# Patient Record
Sex: Male | Born: 1955 | Race: White | Hispanic: No | Smoking: Current every day smoker
Health system: Southern US, Community
[De-identification: ages and names within clinical notes are randomized; demographics above are authoritative.]

## PROBLEM LIST (undated history)

## (undated) DIAGNOSIS — J449 Chronic obstructive pulmonary disease, unspecified: Secondary | ICD-10-CM

## (undated) DIAGNOSIS — Z720 Tobacco use: Secondary | ICD-10-CM

## (undated) DIAGNOSIS — E785 Hyperlipidemia, unspecified: Secondary | ICD-10-CM

## (undated) DIAGNOSIS — I10 Essential (primary) hypertension: Secondary | ICD-10-CM

## (undated) DIAGNOSIS — M1990 Unspecified osteoarthritis, unspecified site: Secondary | ICD-10-CM

## (undated) DIAGNOSIS — R9431 Abnormal electrocardiogram [ECG] [EKG]: Secondary | ICD-10-CM

## (undated) DIAGNOSIS — G4700 Insomnia, unspecified: Secondary | ICD-10-CM

## (undated) DIAGNOSIS — M109 Gout, unspecified: Secondary | ICD-10-CM

## (undated) DIAGNOSIS — N189 Chronic kidney disease, unspecified: Secondary | ICD-10-CM

## (undated) DIAGNOSIS — K759 Inflammatory liver disease, unspecified: Secondary | ICD-10-CM

## (undated) DIAGNOSIS — I2510 Atherosclerotic heart disease of native coronary artery without angina pectoris: Secondary | ICD-10-CM

## (undated) DIAGNOSIS — I219 Acute myocardial infarction, unspecified: Secondary | ICD-10-CM

## (undated) DIAGNOSIS — S065X9A Traumatic subdural hemorrhage with loss of consciousness of unspecified duration, initial encounter: Secondary | ICD-10-CM

## (undated) HISTORY — DX: Inflammatory liver disease, unspecified: K75.9

## (undated) HISTORY — DX: Insomnia, unspecified: G47.00

## (undated) HISTORY — DX: Gout, unspecified: M10.9

## (undated) HISTORY — DX: Hyperlipidemia, unspecified: E78.5

## (undated) HISTORY — PX: CARDIAC CATHETERIZATION: SHX172

## (undated) HISTORY — DX: Traumatic subdural hemorrhage with loss of consciousness of unspecified duration, initial encounter: S06.5X9A

## (undated) HISTORY — DX: Abnormal electrocardiogram (ECG) (EKG): R94.31

## (undated) HISTORY — DX: Essential (primary) hypertension: I10

---

## 1982-12-13 HISTORY — PX: APPENDECTOMY: SHX54

## 1997-12-13 DIAGNOSIS — I219 Acute myocardial infarction, unspecified: Secondary | ICD-10-CM

## 1997-12-13 HISTORY — DX: Acute myocardial infarction, unspecified: I21.9

## 2006-01-31 DIAGNOSIS — K219 Gastro-esophageal reflux disease without esophagitis: Secondary | ICD-10-CM | POA: Insufficient documentation

## 2006-02-02 ENCOUNTER — Ambulatory Visit: Payer: Self-pay | Admitting: Family Medicine

## 2006-02-08 ENCOUNTER — Encounter: Admission: RE | Admit: 2006-02-08 | Discharge: 2006-02-08 | Payer: Self-pay | Admitting: Neurology

## 2006-02-22 ENCOUNTER — Encounter: Admission: RE | Admit: 2006-02-22 | Discharge: 2006-02-22 | Payer: Self-pay | Admitting: Neurology

## 2006-11-30 ENCOUNTER — Emergency Department: Payer: Self-pay | Admitting: Emergency Medicine

## 2006-12-11 ENCOUNTER — Emergency Department: Payer: Self-pay | Admitting: Emergency Medicine

## 2009-02-03 DIAGNOSIS — R9431 Abnormal electrocardiogram [ECG] [EKG]: Secondary | ICD-10-CM

## 2009-02-03 HISTORY — DX: Abnormal electrocardiogram (ECG) (EKG): R94.31

## 2010-07-27 ENCOUNTER — Emergency Department: Payer: Self-pay | Admitting: Emergency Medicine

## 2011-03-01 ENCOUNTER — Inpatient Hospital Stay: Payer: Self-pay | Admitting: *Deleted

## 2011-03-02 HISTORY — PX: OTHER SURGICAL HISTORY: SHX169

## 2011-03-11 ENCOUNTER — Ambulatory Visit: Payer: Self-pay | Admitting: Internal Medicine

## 2013-01-02 ENCOUNTER — Ambulatory Visit: Payer: Self-pay | Admitting: Family Medicine

## 2013-01-02 LAB — BASIC METABOLIC PANEL
BUN: 19 mg/dL (ref 4–21)
Creatinine: 1.5 mg/dL — AB (ref <=1.3)
GLUCOSE: 113 mg/dL
Potassium: 4.3 mmol/L (ref 3.4–5.3)
Sodium: 140 mmol/L (ref 137–147)

## 2013-01-02 LAB — LIPID PANEL
CHOLESTEROL: 177 mg/dL (ref 0–200)
HDL: 57 mg/dL (ref 35–70)
LDL Cholesterol: 103 mg/dL
TRIGLYCERIDES: 86 mg/dL (ref 40–160)

## 2013-01-02 LAB — HEPATIC FUNCTION PANEL: ALT: 27 U/L (ref 10–40)

## 2013-07-30 ENCOUNTER — Ambulatory Visit: Payer: Self-pay | Admitting: Internal Medicine

## 2013-08-06 DIAGNOSIS — Z955 Presence of coronary angioplasty implant and graft: Secondary | ICD-10-CM | POA: Insufficient documentation

## 2013-08-06 DIAGNOSIS — F17200 Nicotine dependence, unspecified, uncomplicated: Secondary | ICD-10-CM | POA: Insufficient documentation

## 2015-01-07 DIAGNOSIS — S02401A Maxillary fracture, unspecified, initial encounter for closed fracture: Secondary | ICD-10-CM | POA: Insufficient documentation

## 2015-01-07 DIAGNOSIS — S22009A Unspecified fracture of unspecified thoracic vertebra, initial encounter for closed fracture: Secondary | ICD-10-CM | POA: Insufficient documentation

## 2015-01-07 DIAGNOSIS — S0500XA Injury of conjunctiva and corneal abrasion without foreign body, unspecified eye, initial encounter: Secondary | ICD-10-CM | POA: Insufficient documentation

## 2015-01-07 DIAGNOSIS — I252 Old myocardial infarction: Secondary | ICD-10-CM | POA: Insufficient documentation

## 2015-01-07 DIAGNOSIS — R112 Nausea with vomiting, unspecified: Secondary | ICD-10-CM | POA: Insufficient documentation

## 2015-01-07 DIAGNOSIS — S065X9A Traumatic subdural hemorrhage with loss of consciousness of unspecified duration, initial encounter: Secondary | ICD-10-CM

## 2015-01-07 DIAGNOSIS — S065XAA Traumatic subdural hemorrhage with loss of consciousness status unknown, initial encounter: Secondary | ICD-10-CM

## 2015-01-07 HISTORY — DX: Traumatic subdural hemorrhage with loss of consciousness status unknown, initial encounter: S06.5XAA

## 2015-01-07 HISTORY — DX: Traumatic subdural hemorrhage with loss of consciousness of unspecified duration, initial encounter: S06.5X9A

## 2015-05-29 DIAGNOSIS — G4700 Insomnia, unspecified: Secondary | ICD-10-CM | POA: Insufficient documentation

## 2015-05-29 DIAGNOSIS — M109 Gout, unspecified: Secondary | ICD-10-CM | POA: Insufficient documentation

## 2015-05-29 DIAGNOSIS — I2510 Atherosclerotic heart disease of native coronary artery without angina pectoris: Secondary | ICD-10-CM | POA: Insufficient documentation

## 2015-05-30 ENCOUNTER — Ambulatory Visit (INDEPENDENT_AMBULATORY_CARE_PROVIDER_SITE_OTHER): Payer: BLUE CROSS/BLUE SHIELD | Admitting: Family Medicine

## 2015-05-30 ENCOUNTER — Encounter: Payer: Self-pay | Admitting: Family Medicine

## 2015-05-30 VITALS — BP 148/90 | HR 64 | Temp 97.9°F | Resp 16 | Ht 65.0 in | Wt 193.0 lb

## 2015-05-30 DIAGNOSIS — Z09 Encounter for follow-up examination after completed treatment for conditions other than malignant neoplasm: Secondary | ICD-10-CM | POA: Diagnosis not present

## 2015-05-30 DIAGNOSIS — I10 Essential (primary) hypertension: Secondary | ICD-10-CM

## 2015-05-30 DIAGNOSIS — F17200 Nicotine dependence, unspecified, uncomplicated: Secondary | ICD-10-CM

## 2015-05-30 DIAGNOSIS — I2510 Atherosclerotic heart disease of native coronary artery without angina pectoris: Secondary | ICD-10-CM | POA: Diagnosis not present

## 2015-05-30 DIAGNOSIS — E782 Mixed hyperlipidemia: Secondary | ICD-10-CM | POA: Insufficient documentation

## 2015-05-30 DIAGNOSIS — Z0000 Encounter for general adult medical examination without abnormal findings: Secondary | ICD-10-CM | POA: Diagnosis not present

## 2015-05-30 DIAGNOSIS — H9202 Otalgia, left ear: Secondary | ICD-10-CM

## 2015-05-30 NOTE — Patient Instructions (Signed)
Smoking Cessation Quitting smoking is important to your health and has many advantages. However, it is not always easy to quit since nicotine is a very addictive drug. Oftentimes, people try 3 times or more before being able to quit. This document explains the best ways for you to prepare to quit smoking. Quitting takes hard work and a lot of effort, but you can do it. ADVANTAGES OF QUITTING SMOKING  You will live longer, feel better, and live better.  Your body will feel the impact of quitting smoking almost immediately.  Within 20 minutes, blood pressure decreases. Your pulse returns to its normal level.  After 8 hours, carbon monoxide levels in the blood return to normal. Your oxygen level increases.  After 24 hours, the chance of having a heart attack starts to decrease. Your breath, hair, and body stop smelling like smoke.  After 48 hours, damaged nerve endings begin to recover. Your sense of taste and smell improve.  After 72 hours, the body is virtually free of nicotine. Your bronchial tubes relax and breathing becomes easier.  After 2 to 12 weeks, lungs can hold more air. Exercise becomes easier and circulation improves.  The risk of having a heart attack, stroke, cancer, or lung disease is greatly reduced.  After 1 year, the risk of coronary heart disease is cut in half.  After 5 years, the risk of stroke falls to the same as a nonsmoker.  After 10 years, the risk of lung cancer is cut in half and the risk of other cancers decreases significantly.  After 15 years, the risk of coronary heart disease drops, usually to the level of a nonsmoker.  If you are pregnant, quitting smoking will improve your chances of having a healthy baby.  The people you live with, especially any children, will be healthier.  You will have extra money to spend on things other than cigarettes. QUESTIONS TO THINK ABOUT BEFORE ATTEMPTING TO QUIT You may want to talk about your answers with your  health care provider.  Why do you want to quit?  If you tried to quit in the past, what helped and what did not?  What will be the most difficult situations for you after you quit? How will you plan to handle them?  Who can help you through the tough times? Your family? Friends? A health care provider?  What pleasures do you get from smoking? What ways can you still get pleasure if you quit? Here are some questions to ask your health care provider:  How can you help me to be successful at quitting?  What medicine do you think would be best for me and how should I take it?  What should I do if I need more help?  What is smoking withdrawal like? How can I get information on withdrawal? GET READY  Set a quit date.  Change your environment by getting rid of all cigarettes, ashtrays, matches, and lighters in your home, car, or work. Do not let people smoke in your home.  Review your past attempts to quit. Think about what worked and what did not. GET SUPPORT AND ENCOURAGEMENT You have a better chance of being successful if you have help. You can get support in many ways.  Tell your family, friends, and coworkers that you are going to quit and need their support. Ask them not to smoke around you.  Get individual, group, or telephone counseling and support. Programs are available at local hospitals and health centers. Call   your local health department for information about programs in your area.  Spiritual beliefs and practices may help some smokers quit.  Download a "quit meter" on your computer to keep track of quit statistics, such as how long you have gone without smoking, cigarettes not smoked, and money saved.  Get a self-help book about quitting smoking and staying off tobacco. LEARN NEW SKILLS AND BEHAVIORS  Distract yourself from urges to smoke. Talk to someone, go for a walk, or occupy your time with a task.  Change your normal routine. Take a different route to work.  Drink tea instead of coffee. Eat breakfast in a different place.  Reduce your stress. Take a hot bath, exercise, or read a book.  Plan something enjoyable to do every day. Reward yourself for not smoking.  Explore interactive web-based programs that specialize in helping you quit. GET MEDICINE AND USE IT CORRECTLY Medicines can help you stop smoking and decrease the urge to smoke. Combining medicine with the above behavioral methods and support can greatly increase your chances of successfully quitting smoking.  Nicotine replacement therapy helps deliver nicotine to your body without the negative effects and risks of smoking. Nicotine replacement therapy includes nicotine gum, lozenges, inhalers, nasal sprays, and skin patches. Some may be available over-the-counter and others require a prescription.  Antidepressant medicine helps people abstain from smoking, but how this works is unknown. This medicine is available by prescription.  Nicotinic receptor partial agonist medicine simulates the effect of nicotine in your brain. This medicine is available by prescription. Ask your health care provider for advice about which medicines to use and how to use them based on your health history. Your health care provider will tell you what side effects to look out for if you choose to be on a medicine or therapy. Carefully read the information on the package. Do not use any other product containing nicotine while using a nicotine replacement product.  RELAPSE OR DIFFICULT SITUATIONS Most relapses occur within the first 3 months after quitting. Do not be discouraged if you start smoking again. Remember, most people try several times before finally quitting. You may have symptoms of withdrawal because your body is used to nicotine. You may crave cigarettes, be irritable, feel very hungry, cough often, get headaches, or have difficulty concentrating. The withdrawal symptoms are only temporary. They are strongest  when you first quit, but they will go away within 10-14 days. To reduce the chances of relapse, try to:  Avoid drinking alcohol. Drinking lowers your chances of successfully quitting.  Reduce the amount of caffeine you consume. Once you quit smoking, the amount of caffeine in your body increases and can give you symptoms, such as a rapid heartbeat, sweating, and anxiety.  Avoid smokers because they can make you want to smoke.  Do not let weight gain distract you. Many smokers will gain weight when they quit, usually less than 10 pounds. Eat a healthy diet and stay active. You can always lose the weight gained after you quit.  Find ways to improve your mood other than smoking. FOR MORE INFORMATION  www.smokefree.gov  Document Released: 11/23/2001 Document Revised: 04/15/2014 Document Reviewed: 03/09/2012 [employer] Patient Information 2015 [employer]. This information is not intended to replace advice given to you by your health care provider. Make sure you discuss any questions you have with your health care provider.  
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
## 2015-05-31 LAB — COMPREHENSIVE METABOLIC PANEL
A/G RATIO: 1.7 (ref 1.1–2.5)
ALBUMIN: 3.8 g/dL (ref 3.5–5.5)
ALT: 44 IU/L (ref 0–44)
AST: 41 IU/L — ABNORMAL HIGH (ref 0–40)
Alkaline Phosphatase: 97 IU/L (ref 39–117)
BILIRUBIN TOTAL: 0.4 mg/dL (ref 0.0–1.2)
BUN / CREAT RATIO: 11 (ref 9–20)
BUN: 23 mg/dL (ref 6–24)
CHLORIDE: 101 mmol/L (ref 97–108)
CO2: 24 mmol/L (ref 18–29)
Calcium: 9.1 mg/dL (ref 8.7–10.2)
Creatinine, Ser: 2.09 mg/dL — ABNORMAL HIGH (ref 0.76–1.27)
GFR, EST AFRICAN AMERICAN: 40 mL/min/{1.73_m2} — AB (ref >59)
GFR, EST NON AFRICAN AMERICAN: 34 mL/min/{1.73_m2} — AB (ref >59)
Globulin, Total: 2.3 g/dL (ref 1.5–4.5)
Glucose: 102 mg/dL — ABNORMAL HIGH (ref 65–99)
POTASSIUM: 4.6 mmol/L (ref 3.5–5.2)
Sodium: 141 mmol/L (ref 134–144)
Total Protein: 6.1 g/dL (ref 6.0–8.5)

## 2015-05-31 LAB — LIPID PANEL
Chol/HDL Ratio: 2.5 ratio units (ref 0.0–5.0)
Cholesterol, Total: 131 mg/dL (ref 100–199)
HDL: 53 mg/dL (ref >39)
LDL Calculated: 60 mg/dL (ref 0–99)
Triglycerides: 92 mg/dL (ref 0–149)
VLDL Cholesterol Cal: 18 mg/dL (ref 5–40)

## 2015-05-31 LAB — PSA: Prostate Specific Ag, Serum: 0.5 ng/mL (ref 0.0–4.0)

## 2015-06-03 ENCOUNTER — Telehealth: Payer: Self-pay | Admitting: Family Medicine

## 2015-06-03 ENCOUNTER — Telehealth: Payer: Self-pay

## 2015-06-03 NOTE — Telephone Encounter (Signed)
Pt is returning call.  [id number]

## 2015-06-03 NOTE — Telephone Encounter (Signed)
-----   Message from Birdie Sons, MD sent at 05/31/2015  8:26 AM EDT ----- Cholesterol is good at 131, and PSA is normal. But kidney functions are getting worse. Need to start drinking more water, 8-9 glasses a day. Continue current medications.  Need to recheck renal panel in 3-4 weeks. May need to nephrologist into better.

## 2015-06-03 NOTE — Telephone Encounter (Signed)
LMTCB Aracelly Tencza Drozdowski, CMA  

## 2015-06-03 NOTE — Telephone Encounter (Signed)
LMTCB 06/03/2015    Thanks,   -Mickel Baas

## 2015-06-04 NOTE — Telephone Encounter (Signed)
Patient returned call and was given results.

## 2015-06-04 NOTE — Telephone Encounter (Signed)
LMTCB 06/04/2015  Thanks,   -Mickel Baas

## 2015-06-04 NOTE — Telephone Encounter (Signed)
Pt returned nurse call about results. Thanks TNP

## 2016-09-07 ENCOUNTER — Ambulatory Visit (INDEPENDENT_AMBULATORY_CARE_PROVIDER_SITE_OTHER): Payer: Worker's Compensation

## 2016-09-07 ENCOUNTER — Ambulatory Visit
Admission: EM | Admit: 2016-09-07 | Discharge: 2016-09-07 | Disposition: A | Discharge status: Home / Self Care | Payer: Worker's Compensation | Attending: Family Medicine | Admitting: Family Medicine

## 2016-09-07 DIAGNOSIS — S60212A Contusion of left wrist, initial encounter: Secondary | ICD-10-CM | POA: Diagnosis not present

## 2016-09-07 DIAGNOSIS — S60222A Contusion of left hand, initial encounter: Secondary | ICD-10-CM

## 2016-09-07 MED ORDER — OXYCODONE-ACETAMINOPHEN 5-325 MG PO TABS: 1.0000 | ORAL_TABLET | Freq: Every evening | ORAL | 0 refills | Status: DC | PRN

## 2016-09-07 NOTE — Discharge Instructions (Signed)
Take medication as prescribed. Rest. Apply ice and elevate. Wear wrist splint as long as pain continues. Stretch daily.   Follow up with Blima Singer this week as discussed. See above to call tomorrow to schedule.   Follow up with your primary care physician this week as needed. Return to Urgent care for new or worsening concerns.
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
## 2016-09-07 NOTE — ED Triage Notes (Addendum)
Patient presents with a swollen left hand that he says he dropped a 100lb shaft on at work yesterday around 6:45pm. Patient unable to make a fist. This is a workers comp visit.

## 2016-09-11 ENCOUNTER — Telehealth: Payer: Self-pay

## 2016-09-11 NOTE — Telephone Encounter (Signed)
Courtesy call back completed today for patient's recent visit at [facility]. Patient did not answer, left message on machine to call back with any questions or concerns.   

## 2016-12-02 ENCOUNTER — Other Ambulatory Visit: Payer: Self-pay | Admitting: Nephrology

## 2016-12-02 DIAGNOSIS — N184 Chronic kidney disease, stage 4 (severe): Secondary | ICD-10-CM

## 2016-12-08 ENCOUNTER — Ambulatory Visit
Admission: RE | Admit: 2016-12-08 | Discharge: 2016-12-08 | Disposition: A | Discharge status: Home / Self Care | Payer: BLUE CROSS/BLUE SHIELD | Source: Ambulatory Visit | Attending: Nephrology | Admitting: Nephrology

## 2016-12-08 DIAGNOSIS — N184 Chronic kidney disease, stage 4 (severe): Secondary | ICD-10-CM | POA: Insufficient documentation

## 2017-01-12 ENCOUNTER — Other Ambulatory Visit: Payer: Self-pay | Admitting: Nephrology

## 2017-01-12 DIAGNOSIS — R809 Proteinuria, unspecified: Secondary | ICD-10-CM

## 2017-01-24 ENCOUNTER — Encounter
Admission: RE | Admit: 2017-01-24 | Discharge: 2017-01-24 | Disposition: A | Discharge status: Home / Self Care | Payer: BLUE CROSS/BLUE SHIELD | Source: Ambulatory Visit | Attending: Nephrology | Admitting: Nephrology

## 2017-01-24 ENCOUNTER — Other Ambulatory Visit: Admission: RE | Admit: 2017-01-24 | Payer: BLUE CROSS/BLUE SHIELD | Source: Ambulatory Visit | Admitting: Nephrology

## 2017-01-24 DIAGNOSIS — E785 Hyperlipidemia, unspecified: Secondary | ICD-10-CM | POA: Diagnosis not present

## 2017-01-24 DIAGNOSIS — R809 Proteinuria, unspecified: Secondary | ICD-10-CM | POA: Diagnosis present

## 2017-01-24 DIAGNOSIS — I129 Hypertensive chronic kidney disease with stage 1 through stage 4 chronic kidney disease, or unspecified chronic kidney disease: Secondary | ICD-10-CM | POA: Diagnosis not present

## 2017-01-24 DIAGNOSIS — Z955 Presence of coronary angioplasty implant and graft: Secondary | ICD-10-CM | POA: Diagnosis not present

## 2017-01-24 DIAGNOSIS — B1920 Unspecified viral hepatitis C without hepatic coma: Secondary | ICD-10-CM | POA: Diagnosis not present

## 2017-01-24 DIAGNOSIS — N99840 Postprocedural hematoma of a genitourinary system organ or structure following a genitourinary system procedure: Secondary | ICD-10-CM | POA: Diagnosis not present

## 2017-01-24 DIAGNOSIS — Z79899 Other long term (current) drug therapy: Secondary | ICD-10-CM | POA: Diagnosis not present

## 2017-01-24 DIAGNOSIS — Y848 Other medical procedures as the cause of abnormal reaction of the patient, or of later complication, without mention of misadventure at the time of the procedure: Secondary | ICD-10-CM | POA: Diagnosis not present

## 2017-01-24 DIAGNOSIS — N189 Chronic kidney disease, unspecified: Secondary | ICD-10-CM | POA: Diagnosis not present

## 2017-01-24 DIAGNOSIS — F17200 Nicotine dependence, unspecified, uncomplicated: Secondary | ICD-10-CM | POA: Diagnosis not present

## 2017-01-24 DIAGNOSIS — I2510 Atherosclerotic heart disease of native coronary artery without angina pectoris: Secondary | ICD-10-CM | POA: Diagnosis not present

## 2017-01-24 LAB — CBC
HCT: 38.1 % — ABNORMAL LOW (ref 40.0–52.0)
Hemoglobin: 13.3 g/dL (ref 13.0–18.0)
MCH: 32.2 pg (ref 26.0–34.0)
MCHC: 35 g/dL (ref 32.0–36.0)
MCV: 92.1 fL (ref 80.0–100.0)
PLATELETS: 127 10*3/uL — AB (ref 150–440)
RBC: 4.14 MIL/uL — ABNORMAL LOW (ref 4.40–5.90)
RDW: 13.7 % (ref 11.5–14.5)
WBC: 7.1 10*3/uL (ref 3.8–10.6)

## 2017-01-24 LAB — DIFFERENTIAL
BASOS ABS: 0.1 10*3/uL (ref 0–0.1)
BASOS PCT: 1 %
Eosinophils Absolute: 0.8 10*3/uL — ABNORMAL HIGH (ref 0–0.7)
Eosinophils Relative: 11 %
Lymphocytes Relative: 19 %
Lymphs Abs: 1.3 10*3/uL (ref 1.0–3.6)
MONO ABS: 0.6 10*3/uL (ref 0.2–1.0)
MONOS PCT: 9 %
NEUTROS ABS: 4.3 10*3/uL (ref 1.4–6.5)
Neutrophils Relative %: 60 %

## 2017-01-24 LAB — PROTIME-INR
INR: 1.02
Prothrombin Time: 13.4 seconds (ref 11.4–15.2)

## 2017-01-24 LAB — COMPREHENSIVE METABOLIC PANEL
ALBUMIN: 3.4 g/dL — AB (ref 3.5–5.0)
ALK PHOS: 101 U/L (ref 38–126)
ALT: 39 U/L (ref 17–63)
ANION GAP: 7 (ref 5–15)
AST: 46 U/L — ABNORMAL HIGH (ref 15–41)
BILIRUBIN TOTAL: 0.6 mg/dL (ref 0.3–1.2)
BUN: 31 mg/dL — ABNORMAL HIGH (ref 6–20)
CALCIUM: 8.4 mg/dL — AB (ref 8.9–10.3)
CO2: 23 mmol/L (ref 22–32)
Chloride: 106 mmol/L (ref 101–111)
Creatinine, Ser: 2.32 mg/dL — ABNORMAL HIGH (ref 0.61–1.24)
GFR calc non Af Amer: 29 mL/min — ABNORMAL LOW (ref >60)
GFR, EST AFRICAN AMERICAN: 34 mL/min — AB (ref >60)
GLUCOSE: 172 mg/dL — AB (ref 65–99)
Potassium: 3.4 mmol/L — ABNORMAL LOW (ref 3.5–5.1)
Sodium: 136 mmol/L (ref 135–145)
TOTAL PROTEIN: 6.3 g/dL — AB (ref 6.5–8.1)

## 2017-01-24 LAB — URINALYSIS, ROUTINE W REFLEX MICROSCOPIC
BACTERIA UA: NONE SEEN
Bilirubin Urine: NEGATIVE
GLUCOSE, UA: NEGATIVE mg/dL
Hgb urine dipstick: NEGATIVE
KETONES UR: NEGATIVE mg/dL
LEUKOCYTES UA: NEGATIVE
Nitrite: NEGATIVE
Specific Gravity, Urine: 1.01 (ref 1.005–1.030)
pH: 5 (ref 5.0–8.0)

## 2017-01-24 LAB — PROTEIN / CREATININE RATIO, URINE
Creatinine, Urine: 96 mg/dL
Protein Creatinine Ratio: 2.54 mg/mg{Cre} — ABNORMAL HIGH (ref 0.00–0.15)
Total Protein, Urine: 244 mg/dL

## 2017-01-25 LAB — TYPE AND SCREEN
ABO/RH(D): A POS
Antibody Screen: NEGATIVE

## 2017-01-26 ENCOUNTER — Observation Stay
Admission: RE | Admit: 2017-01-26 | Discharge: 2017-01-27 | Disposition: A | Discharge status: Home / Self Care | Payer: BLUE CROSS/BLUE SHIELD | Source: Ambulatory Visit | Attending: Nephrology | Admitting: Nephrology

## 2017-01-26 ENCOUNTER — Observation Stay: Payer: BLUE CROSS/BLUE SHIELD

## 2017-01-26 DIAGNOSIS — E785 Hyperlipidemia, unspecified: Secondary | ICD-10-CM | POA: Insufficient documentation

## 2017-01-26 DIAGNOSIS — N99840 Postprocedural hematoma of a genitourinary system organ or structure following a genitourinary system procedure: Secondary | ICD-10-CM | POA: Insufficient documentation

## 2017-01-26 DIAGNOSIS — I129 Hypertensive chronic kidney disease with stage 1 through stage 4 chronic kidney disease, or unspecified chronic kidney disease: Principal | ICD-10-CM | POA: Insufficient documentation

## 2017-01-26 DIAGNOSIS — R809 Proteinuria, unspecified: Secondary | ICD-10-CM

## 2017-01-26 DIAGNOSIS — I2510 Atherosclerotic heart disease of native coronary artery without angina pectoris: Secondary | ICD-10-CM | POA: Insufficient documentation

## 2017-01-26 DIAGNOSIS — F17200 Nicotine dependence, unspecified, uncomplicated: Secondary | ICD-10-CM | POA: Insufficient documentation

## 2017-01-26 DIAGNOSIS — N189 Chronic kidney disease, unspecified: Secondary | ICD-10-CM | POA: Insufficient documentation

## 2017-01-26 DIAGNOSIS — Y848 Other medical procedures as the cause of abnormal reaction of the patient, or of later complication, without mention of misadventure at the time of the procedure: Secondary | ICD-10-CM | POA: Insufficient documentation

## 2017-01-26 DIAGNOSIS — B1920 Unspecified viral hepatitis C without hepatic coma: Secondary | ICD-10-CM

## 2017-01-26 DIAGNOSIS — Z955 Presence of coronary angioplasty implant and graft: Secondary | ICD-10-CM | POA: Insufficient documentation

## 2017-01-26 DIAGNOSIS — Z79899 Other long term (current) drug therapy: Secondary | ICD-10-CM | POA: Insufficient documentation

## 2017-01-26 HISTORY — DX: Unspecified osteoarthritis, unspecified site: M19.90

## 2017-01-26 HISTORY — DX: Chronic kidney disease, unspecified: N18.9

## 2017-01-26 LAB — CBC
HEMATOCRIT: 37.7 % — AB (ref 40.0–52.0)
Hemoglobin: 13.1 g/dL (ref 13.0–18.0)
MCH: 32.1 pg (ref 26.0–34.0)
MCHC: 34.8 g/dL (ref 32.0–36.0)
MCV: 92.5 fL (ref 80.0–100.0)
Platelets: 138 10*3/uL — ABNORMAL LOW (ref 150–440)
RBC: 4.08 MIL/uL — ABNORMAL LOW (ref 4.40–5.90)
RDW: 13.7 % (ref 11.5–14.5)
WBC: 8.5 10*3/uL (ref 3.8–10.6)

## 2017-01-26 MED ORDER — LOSARTAN POTASSIUM 50 MG PO TABS
100.0000 mg | ORAL_TABLET | Freq: Every day | ORAL | Status: DC
  Administered 2017-01-27: 100 mg via ORAL
  Filled 2017-01-26: qty 2

## 2017-01-26 MED ORDER — AMLODIPINE BESYLATE 10 MG PO TABS
10.0000 mg | ORAL_TABLET | Freq: Every day | ORAL | Status: DC
  Administered 2017-01-27: 10 mg via ORAL
  Filled 2017-01-26: qty 1

## 2017-01-26 MED ORDER — TIOTROPIUM BROMIDE MONOHYDRATE 18 MCG IN CAPS
18.0000 ug | ORAL_CAPSULE | Freq: Every day | RESPIRATORY_TRACT | Status: DC
  Administered 2017-01-27: 18 ug via RESPIRATORY_TRACT
  Filled 2017-01-26: qty 5

## 2017-01-26 MED ORDER — LOSARTAN POTASSIUM-HCTZ 100-12.5 MG PO TABS: 1.0000 | ORAL_TABLET | Freq: Every day | ORAL | Status: DC

## 2017-01-26 MED ORDER — CARVEDILOL 12.5 MG PO TABS
25.0000 mg | ORAL_TABLET | Freq: Two times a day (BID) | ORAL | Status: DC
  Administered 2017-01-26 – 2017-01-27 (×2): 25 mg via ORAL
  Filled 2017-01-26 (×2): qty 2

## 2017-01-26 MED ORDER — HYDROCHLOROTHIAZIDE 12.5 MG PO CAPS
12.5000 mg | ORAL_CAPSULE | Freq: Every day | ORAL | Status: DC
  Administered 2017-01-27: 12.5 mg via ORAL
  Filled 2017-01-26: qty 1

## 2017-01-26 MED ORDER — HYDRALAZINE HCL 50 MG PO TABS
50.0000 mg | ORAL_TABLET | Freq: Three times a day (TID) | ORAL | Status: DC
  Administered 2017-01-26 – 2017-01-27 (×3): 50 mg via ORAL
  Filled 2017-01-26 (×3): qty 1

## 2017-01-26 MED ORDER — ATORVASTATIN CALCIUM 20 MG PO TABS: 80.0000 mg | ORAL_TABLET | Freq: Every day | ORAL | Status: DC

## 2017-01-26 MED ORDER — NICOTINE 14 MG/24HR TD PT24
14.0000 mg | MEDICATED_PATCH | Freq: Every day | TRANSDERMAL | Status: DC
  Administered 2017-01-26 – 2017-01-27 (×2): 14 mg via TRANSDERMAL
  Filled 2017-01-26 (×2): qty 1
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
## 2017-01-26 NOTE — Procedures (Signed)
PROCEDURE: Informed written consent was obtained from the patient after a discussion of the risks, benefits and alternatives to treatment. The patient understands and consents the procedure. A timeout was performed prior to the initiation of the procedure.  Ultrasound scanning was performed of the bilateral flanks. The inferior pole of the LEFT kidney was selected for biopsy due to location and sonographic window. The procedure was planned. The operative site was prepped and draped in the usual sterile fashion. The overlying soft tissues were anesthetized with 10 mL of 1% XYLOCAINE.  A 18 gauge core needle biopsy device was advanced into the inferior cortex of the LEFT  kidney and 4 core biopsies were obtained under direct ultrasound guidance. Real time pathologic review confirmed adequate tissue acquisition. Images were saved for documentation purposes. The biopsy device was removed and hemostasis was obtained with manual compression. Post procedural scanning was negative for significant post procedural hemorrhage or additional complication. A dressing was placed. The patient tolerated the procedure well without immediate post procedural complication.

## 2017-01-27 DIAGNOSIS — I129 Hypertensive chronic kidney disease with stage 1 through stage 4 chronic kidney disease, or unspecified chronic kidney disease: Secondary | ICD-10-CM | POA: Diagnosis not present

## 2017-01-27 LAB — CBC
HCT: 38.7 % — ABNORMAL LOW (ref 40.0–52.0)
Hemoglobin: 13.1 g/dL (ref 13.0–18.0)
MCH: 31.9 pg (ref 26.0–34.0)
MCHC: 34 g/dL (ref 32.0–36.0)
MCV: 93.9 fL (ref 80.0–100.0)
PLATELETS: 121 10*3/uL — AB (ref 150–440)
RBC: 4.12 MIL/uL — AB (ref 4.40–5.90)
RDW: 13.4 % (ref 11.5–14.5)
WBC: 8.5 10*3/uL (ref 3.8–10.6)

## 2017-01-27 MED ORDER — HYDRALAZINE HCL 50 MG PO TABS: 100.0000 mg | ORAL_TABLET | Freq: Three times a day (TID) | ORAL | 0 refills | Status: AC

## 2017-01-27 NOTE — Progress Notes (Signed)
Patient discharged to home. IV site removed. Concerns addressed. Discharge paperwork provided to patient.
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
## 2017-01-27 NOTE — Discharge Summary (Signed)
Patient was admitted for a kidney biopsy for proteinuria. Procedure was done successfully. His hemoglobin stayed stable. He had a small perinephric hematoma. He was discharged in a stable condition. He was advised to avoid lifting over 10 pounds for about 2 weeks. He can resume his aspirin starting Monday. We will follow-up with him in the office.

## 2017-02-01 ENCOUNTER — Encounter: Payer: Self-pay | Admitting: Nephrology

## 2017-02-01 LAB — SURGICAL PATHOLOGY

## 2017-05-30 ENCOUNTER — Other Ambulatory Visit: Payer: Self-pay | Admitting: Student

## 2017-05-30 DIAGNOSIS — B182 Chronic viral hepatitis C: Secondary | ICD-10-CM

## 2017-06-03 ENCOUNTER — Ambulatory Visit
Admission: RE | Admit: 2017-06-03 | Discharge: 2017-06-03 | Disposition: A | Discharge status: Home / Self Care | Payer: BLUE CROSS/BLUE SHIELD | Source: Ambulatory Visit | Attending: Student | Admitting: Student

## 2017-06-03 DIAGNOSIS — B182 Chronic viral hepatitis C: Secondary | ICD-10-CM | POA: Diagnosis not present

## 2018-03-17 ENCOUNTER — Encounter: Payer: Self-pay | Admitting: Student

## 2018-03-20 ENCOUNTER — Ambulatory Visit: Payer: Managed Care, Other (non HMO) | Admitting: Certified Registered Nurse Anesthetist

## 2018-03-20 ENCOUNTER — Encounter
Admission: RE | Disposition: A | Discharge status: Home / Self Care | Payer: Self-pay | Source: Ambulatory Visit | Attending: Unknown Physician Specialty

## 2018-03-20 ENCOUNTER — Ambulatory Visit
Admission: RE | Admit: 2018-03-20 | Discharge: 2018-03-20 | Disposition: A | Discharge status: Home / Self Care | Payer: Managed Care, Other (non HMO) | Source: Ambulatory Visit | Attending: Unknown Physician Specialty | Admitting: Unknown Physician Specialty

## 2018-03-20 ENCOUNTER — Encounter: Payer: Self-pay | Admitting: Certified Registered Nurse Anesthetist

## 2018-03-20 ENCOUNTER — Other Ambulatory Visit: Payer: Self-pay

## 2018-03-20 DIAGNOSIS — I2510 Atherosclerotic heart disease of native coronary artery without angina pectoris: Secondary | ICD-10-CM | POA: Insufficient documentation

## 2018-03-20 DIAGNOSIS — D122 Benign neoplasm of ascending colon: Secondary | ICD-10-CM | POA: Diagnosis not present

## 2018-03-20 DIAGNOSIS — K621 Rectal polyp: Secondary | ICD-10-CM | POA: Diagnosis not present

## 2018-03-20 DIAGNOSIS — Z1211 Encounter for screening for malignant neoplasm of colon: Secondary | ICD-10-CM | POA: Insufficient documentation

## 2018-03-20 DIAGNOSIS — Z955 Presence of coronary angioplasty implant and graft: Secondary | ICD-10-CM | POA: Diagnosis not present

## 2018-03-20 DIAGNOSIS — I10 Essential (primary) hypertension: Secondary | ICD-10-CM | POA: Insufficient documentation

## 2018-03-20 DIAGNOSIS — F17200 Nicotine dependence, unspecified, uncomplicated: Secondary | ICD-10-CM | POA: Diagnosis not present

## 2018-03-20 DIAGNOSIS — Z7982 Long term (current) use of aspirin: Secondary | ICD-10-CM | POA: Insufficient documentation

## 2018-03-20 DIAGNOSIS — M1990 Unspecified osteoarthritis, unspecified site: Secondary | ICD-10-CM | POA: Insufficient documentation

## 2018-03-20 DIAGNOSIS — K635 Polyp of colon: Secondary | ICD-10-CM | POA: Insufficient documentation

## 2018-03-20 DIAGNOSIS — Z79899 Other long term (current) drug therapy: Secondary | ICD-10-CM | POA: Diagnosis not present

## 2018-03-20 HISTORY — DX: Tobacco use: Z72.0

## 2018-03-20 HISTORY — PX: COLONOSCOPY WITH PROPOFOL: SHX5780

## 2018-03-20 HISTORY — DX: Atherosclerotic heart disease of native coronary artery without angina pectoris: I25.10

## 2018-03-20 SURGERY — COLONOSCOPY WITH PROPOFOL
Anesthesia: General

## 2018-03-20 MED ORDER — SODIUM CHLORIDE 0.9 % IV SOLN
INTRAVENOUS | Status: DC
  Administered 2018-03-20: 14:00:00 via INTRAVENOUS

## 2018-03-20 MED ORDER — PROPOFOL 500 MG/50ML IV EMUL
INTRAVENOUS | Status: AC
  Filled 2018-03-20: qty 50

## 2018-03-20 MED ORDER — PROPOFOL 500 MG/50ML IV EMUL
INTRAVENOUS | Status: DC | PRN
  Administered 2018-03-20: 200 ug/kg/min via INTRAVENOUS

## 2018-03-20 MED ORDER — LIDOCAINE HCL (CARDIAC) 20 MG/ML IV SOLN
INTRAVENOUS | Status: DC | PRN
  Administered 2018-03-20: 50 mg via INTRAVENOUS

## 2018-03-20 MED ORDER — PROPOFOL 10 MG/ML IV BOLUS
INTRAVENOUS | Status: DC | PRN
  Administered 2018-03-20: 30 mg via INTRAVENOUS
  Administered 2018-03-20: 50 mg via INTRAVENOUS
  Administered 2018-03-20: 60 mg via INTRAVENOUS

## 2018-03-20 NOTE — Anesthesia Post-op Follow-up Note (Signed)
Anesthesia QCDR form completed.        

## 2018-03-20 NOTE — Anesthesia Procedure Notes (Signed)
Date/Time: 03/20/2018 1:48 PM Performed by: Johnna Acosta, CRNA Pre-anesthesia Checklist: Patient identified, Emergency Drugs available, Suction available, Patient being monitored and Timeout performed Patient Re-evaluated:Patient Re-evaluated prior to induction Oxygen Delivery Method: Nasal cannula Preoxygenation: Pre-oxygenation with 100% oxygen

## 2018-03-20 NOTE — Transfer of Care (Signed)
Immediate Anesthesia Transfer of Care Note  Patient: Willie Hahn  Procedure(s) Performed: COLONOSCOPY WITH PROPOFOL (N/A )  Patient Location: PACU  Anesthesia Type:General  Level of Consciousness: sedated  Airway & Oxygen Therapy: Patient Spontanous Breathing and Patient connected to nasal cannula oxygen  Post-op Assessment: Report given to RN and Post -op Vital signs reviewed and stable  Post vital signs: Reviewed and stable  Last Vitals:  Vitals Value Taken Time  BP 110/58 03/20/2018  2:20 PM  Temp 36 C 03/20/2018  2:20 PM  Pulse 63 03/20/2018  2:21 PM  Resp 14 03/20/2018  2:21 PM  SpO2 98 % 03/20/2018  2:21 PM  Vitals shown include unvalidated device data.  Last Pain:  Vitals:   03/20/18 1420  TempSrc: Tympanic  PainSc: 0-No pain         Complications: No apparent anesthesia complications

## 2018-03-20 NOTE — Op Note (Signed)
[facility] Gastroenterology Patient Name: Willie Hahn Procedure Date: 03/20/2018 1:46 PM MRN: [id number] Account #: [id number] Date of Birth: 1956-10-20 Admit Type: Outpatient Age: 62 Room: [facility] ENDO ROOM 1 Gender: Male Note Status: Finalized Procedure:            Colonoscopy Indications:          Screening for colorectal malignant neoplasm Providers:            Manya Silvas, MD Referring MD:         Kirstie Peri. Caryn Section, MD (Referring MD) Medicines:            Propofol per Anesthesia Complications:        No immediate complications. Procedure:            Pre-Anesthesia Assessment:                       - After reviewing the risks and benefits, the patient                        was deemed in satisfactory condition to undergo the                        procedure.                       After obtaining informed consent, the colonoscope was                        passed under direct vision. Throughout the procedure,                        the patient's blood pressure, pulse, and oxygen                        saturations were monitored continuously. The                        Colonoscope was introduced through the anus and                        advanced to the the cecum, identified by appendiceal                        orifice and ileocecal valve. The colonoscopy was                        performed without difficulty. The patient tolerated the                        procedure well. The quality of the bowel preparation                        was adequate to identify polyps. Findings:      Two sessile polyps were found in the ascending colon. The polyps were       diminutive in size. These polyps were removed with a jumbo cold forceps.       Resection and retrieval were complete.      A small polyp was found in the ascending colon. The polyp was sessile.       The polyp was removed with a hot snare.  Resection and retrieval were       complete.      A diminutive  polyp was found in the transverse colon. The polyp was       sessile. The polyp was removed with a jumbo cold forceps. Resection and       retrieval were complete.      A diminutive polyp was found in the rectum. The polyp was sessile. The       polyp was removed with a jumbo cold forceps. Resection and retrieval       were complete. Impression:           - Two diminutive polyps in the ascending colon, removed                        with a jumbo cold forceps. Resected and retrieved.                       - One small polyp in the ascending colon, removed with                        a hot snare. Resected and retrieved.                       - One diminutive polyp in the transverse colon, removed                        with a jumbo cold forceps. Resected and retrieved.                       - One diminutive polyp in the rectum, removed with a                        jumbo cold forceps. Resected and retrieved. Recommendation:       - Await pathology results. Manya Silvas, MD 03/20/2018 2:20:27 PM This report has been signed electronically. Number of Addenda: 0 Note Initiated On: 03/20/2018 1:46 PM Scope Withdrawal Time: 0 hours 16 minutes 33 seconds  Total Procedure Duration: 0 hours 23 minutes 42 seconds       [facility]

## 2018-03-20 NOTE — Anesthesia Preprocedure Evaluation (Signed)
Anesthesia Evaluation  Patient identified by MRN, date of birth, ID band Patient awake    Reviewed: Allergy & Precautions, NPO status , Patient's Chart, lab work & pertinent test results, reviewed documented beta blocker date and time   Airway Mallampati: III  TM Distance: >3 FB     Dental  (+) Chipped   Pulmonary Current Smoker,           Cardiovascular hypertension, Pt. on medications and Pt. on home beta blockers + CAD and + Cardiac Stents       Neuro/Psych    GI/Hepatic GERD  ,(+) Hepatitis -  Endo/Other    Renal/GU Renal disease     Musculoskeletal  (+) Arthritis ,   Abdominal   Peds  Hematology   Anesthesia Other Findings   Reproductive/Obstetrics                             Anesthesia Physical Anesthesia Plan  ASA: III  Anesthesia Plan: General   Post-op Pain Management:    Induction: Intravenous  PONV Risk Score and Plan:   Airway Management Planned:   Additional Equipment:   Intra-op Plan:   Post-operative Plan:   Informed Consent: I have reviewed the patients History and Physical, chart, labs and discussed the procedure including the risks, benefits and alternatives for the proposed anesthesia with the patient or authorized representative who has indicated his/her understanding and acceptance.     Plan Discussed with: CRNA  Anesthesia Plan Comments:         Anesthesia Quick Evaluation

## 2018-03-20 NOTE — Anesthesia Postprocedure Evaluation (Signed)
Anesthesia Post Note  Patient: Willie Hahn  Procedure(s) Performed: COLONOSCOPY WITH PROPOFOL (N/A )  Patient location during evaluation: Endoscopy Anesthesia Type: General Level of consciousness: awake and alert Pain management: pain level controlled Vital Signs Assessment: post-procedure vital signs reviewed and stable Respiratory status: spontaneous breathing, nonlabored ventilation, respiratory function stable and patient connected to nasal cannula oxygen Cardiovascular status: blood pressure returned to baseline and stable Postop Assessment: no apparent nausea or vomiting Anesthetic complications: no     Last Vitals:  Vitals:   03/20/18 1440 03/20/18 1450  BP: 130/73 137/67  Pulse: 62 61  Resp: 19 14  Temp:    SpO2: 98% 99%    Last Pain:  Vitals:   03/20/18 1450  TempSrc:   PainSc: 0-No pain                 Irfan Veal S

## 2018-03-21 ENCOUNTER — Encounter: Payer: Self-pay | Admitting: Unknown Physician Specialty

## 2018-03-22 LAB — SURGICAL PATHOLOGY

## 2018-03-23 ENCOUNTER — Encounter: Payer: Self-pay | Admitting: Family Medicine

## 2018-03-23 DIAGNOSIS — Z86010 Personal history of colonic polyps: Secondary | ICD-10-CM | POA: Insufficient documentation

## 2018-03-24 LAB — PATHOLOGY

## 2018-06-22 ENCOUNTER — Encounter (INDEPENDENT_AMBULATORY_CARE_PROVIDER_SITE_OTHER): Payer: Self-pay | Admitting: Vascular Surgery

## 2018-06-22 ENCOUNTER — Ambulatory Visit (INDEPENDENT_AMBULATORY_CARE_PROVIDER_SITE_OTHER): Payer: Managed Care, Other (non HMO) | Admitting: Vascular Surgery

## 2018-06-22 VITALS — BP 136/76 | HR 65 | Resp 16 | Ht 66.0 in | Wt 186.0 lb

## 2018-06-22 DIAGNOSIS — I2510 Atherosclerotic heart disease of native coronary artery without angina pectoris: Secondary | ICD-10-CM

## 2018-06-22 DIAGNOSIS — N289 Disorder of kidney and ureter, unspecified: Secondary | ICD-10-CM

## 2018-06-22 DIAGNOSIS — E7800 Pure hypercholesterolemia, unspecified: Secondary | ICD-10-CM

## 2018-06-22 DIAGNOSIS — I10 Essential (primary) hypertension: Secondary | ICD-10-CM

## 2018-06-22 NOTE — Progress Notes (Signed)
MRN : [id number]  Willie Hahn is a 62 y.o. (27-Mar-1956) male who presents with chief complaint of  Chief Complaint  Patient presents with  . New Patient (Initial Visit)    PD cath Evaluation  .  History of Present Illness:   The patient is seen for evaluation for dialysis access. The patient has chronic renal insufficiency stage V secondary to hypertension and diabetes. The patient's most recent creatinine clearance is less than 20. The patient volume status has not yet become an issue. Patient's blood pressures been relatively well controlled. There are mild uremic symptoms which appear to be relatively well tolerated at this time. The patient is right-handed.  The patient has been considering the various methods of dialysis and wishes to proceed with PD  The patient denies amaurosis fugax or recent TIA symptoms. There are no recent neurological changes noted. The patient denies claudication symptoms or rest pain symptoms. The patient denies history of DVT, PE or superficial thrombophlebitis. The patient denies recent episodes of angina or shortness of breath.   Current Meds  Medication Sig  . amLODipine (NORVASC) 10 MG tablet Take 10 mg by mouth daily.  Marland Kitchen aspirin 81 MG chewable tablet Chew 81 mg by mouth daily.  Marland Kitchen atorvastatin (LIPITOR) 80 MG tablet Take 80 mg by mouth daily.   . carvedilol (COREG) 25 MG tablet Take 25 mg by mouth 2 (two) times daily with a meal.  . hydrALAZINE (APRESOLINE) 50 MG tablet Take 2 tablets (100 mg total) by mouth 3 (three) times daily.  Marland Kitchen losartan-hydrochlorothiazide (HYZAAR) 100-12.5 MG per tablet Take 1 tablet by mouth daily.   Marland Kitchen tiotropium (SPIRIVA) 18 MCG inhalation capsule Place 18 mcg into inhaler and inhale daily.    Past Medical History:  Diagnosis Date  . Abnormal EKG 02/03/2009  . Arthritis   . Chronic kidney disease   . Coronary artery disease   . Gout   . Hepatitis   . Hyperlipidemia   . Hypertension   . Insomnia   .  Intracranial subdural hematoma ([place]) 01/07/2015  . Tobacco abuse     Past Surgical History:  Procedure Laterality Date  . APPENDECTOMY  1984  . cardiac catherization   03/02/2011   Angioplasty and stenting of 95% LAD lesion by Dr. Clayborn Bigness  . CARDIAC CATHETERIZATION    . COLONOSCOPY WITH PROPOFOL N/A 03/20/2018   Procedure: COLONOSCOPY WITH PROPOFOL;  Surgeon: Manya Silvas, MD;  Location[facility] END[facility]COPY;  Service: Endoscopy;  Laterality: N/A;  . CORONARY ARTERY BYPASS GRAFT      Social History Social History   Tobacco Use  . Smoking status: Current Every Day Smoker    Packs/day: 0.50    Years: 40.00    Pack years: 20.00    Types: Cigarettes  . Smokeless tobacco: Never Used  . Tobacco comment: 30 - 40 years  Substance Use Topics  . Alcohol use: No    Alcohol/week: 0.0 oz  . Drug use: No    Family History Family History  Problem Relation Age of Onset  . Cancer Mother        Lung Cancer  . CAD Father   . Diabetes Father   . Cancer Father   *No family history of bleeding/clotting disorders, porphyria or autoimmune disease   Allergies  Allergen Reactions  . Ambien [Zolpidem Tartrate] Other (See Comments)    Headaches     REVIEW OF SYSTEMS (Negative unless checked)  Constitutional: [] Weight loss  [] Fever  [] Chills Cardiac: []   Chest pain   [] Chest pressure   [] Palpitations   [] Shortness of breath when laying flat   [] Shortness of breath with exertion. Vascular:  [] Pain in legs with walking   [] Pain in legs at rest  [] History of DVT   [] Phlebitis   [] Swelling in legs   [] Varicose veins   [] Non-healing ulcers Pulmonary:   [] Uses home oxygen   [] Productive cough   [] Hemoptysis   [] Wheeze  [] COPD   [] Asthma Neurologic:  [] Dizziness   [] Seizures   [] History of stroke   [] History of TIA  [] Aphasia   [] Vissual changes   [] Weakness or numbness in arm   [] Weakness or numbness in leg Musculoskeletal:   [] Joint swelling   [] Joint pain   [] Low back pain Hematologic:  [] Easy  bruising  [] Easy bleeding   [] Hypercoagulable state   [] Anemic Gastrointestinal:  [] Diarrhea   [] Vomiting  [] Gastroesophageal reflux/heartburn   [] Difficulty swallowing. Genitourinary:  [x] Chronic kidney disease   [] Difficult urination  [] Frequent urination   [] Blood in urine Skin:  [] Rashes   [] Ulcers  Psychological:  [] History of anxiety   []  History of major depression.  Physical Examination  Vitals:   06/22/18 0902  BP: 136/76  Pulse: 65  Resp: 16  Weight: 186 lb (84.4 kg)  Height: 5\' 6"  (1.676 m)   Body mass index is 30.02 kg/m. Gen: WD/WN, NAD Head: [place]/AT, No temporalis wasting.  Ear/Nose/Throat: Hearing grossly intact, nares w/o erythema or drainage, poor dentition Eyes: PER, EOMI, sclera nonicteric.  Neck: Supple, no masses.  No bruit or JVD.  Pulmonary:  Good air movement, clear to auscultation bilaterally, no use of accessory muscles.  Cardiac: RRR, normal S1, S2, no Murmurs. Vascular:  Vessel Right Left  Radial Palpable Palpable  Gastrointestinal: soft, non-distended. No guarding/no peritoneal signs.  Musculoskeletal: M/S 5/5 throughout.  No deformity or atrophy.  Neurologic: CN 2-12 intact. Pain and light touch intact in extremities.  Symmetrical.  Speech is fluent. Motor exam as listed above. Psychiatric: Judgment intact, Mood & affect appropriate for pt's clinical situation. Dermatologic: No rashes or ulcers noted.  No changes consistent with cellulitis. Lymph : No Cervical lymphadenopathy, no lichenification or skin changes of chronic lymphedema.  CBC Lab Results  Component Value Date   WBC 8.5 01/27/2017   HGB 13.1 01/27/2017   HCT 38.7 (L) 01/27/2017   MCV 93.9 01/27/2017   PLT 121 (L) 01/27/2017    BMET    Component Value Date/Time   NA 136 01/24/2017 0901   NA 141 05/30/2015 1115   K 3.4 (L) 01/24/2017 0901   CL 106 01/24/2017 0901   CO2 23 01/24/2017 0901   GLUCOSE 172 (H) 01/24/2017 0901   BUN 31 (H) 01/24/2017 0901   BUN 23 05/30/2015 1115     CREATININE 2.32 (H) 01/24/2017 0901   CALCIUM 8.4 (L) 01/24/2017 0901   GFRNONAA 29 (L) 01/24/2017 0901   GFRAA 34 (L) 01/24/2017 0901   CrCl cannot be calculated (Patient's most recent lab result is older than the maximum 21 days allowed.).  COAG Lab Results  Component Value Date   INR 1.02 01/24/2017    Radiology No results found.   Assessment/Plan 1. Renal insufficiency  Recommend:  The patient has advanced renal disease but currently does not have dialysis access.  The patient has elected to move forward with peritoneal dialysis. Although there is a history of prior operations, this does not appear at this time to be a prohibitive situation.  This was also discussed.  Risk  and benefits were reviewed the patient.  Indications for the procedure were reviewed.  All questions were answered, the patient agrees to proceed with laparoscopic PD catheter insertion.   A total of 50 minutes was spent with this patient and greater than 50% was spent in counseling and coordination of care with the patient.  Discussion included the treatment options for dialysis access and perioperative management.    2. Essential hypertension Continue antihypertensive medications as already ordered, these medications have been reviewed and there are no changes at this time.   3. Coronary artery disease involving native coronary artery of native heart without angina pectoris Continue cardiac and antihypertensive medications as already ordered and reviewed, no changes at this time.  Continue statin as ordered and reviewed, no changes at this time  Nitrates PRN for chest pain   4. Hypercholesteremia Continue statin as ordered and reviewed, no changes at this time     Hortencia Pilar, MD  06/22/2018 9:15 PM

## 2018-07-04 ENCOUNTER — Telehealth (INDEPENDENT_AMBULATORY_CARE_PROVIDER_SITE_OTHER): Payer: Self-pay

## 2018-07-04 ENCOUNTER — Telehealth (INDEPENDENT_AMBULATORY_CARE_PROVIDER_SITE_OTHER): Payer: Self-pay | Admitting: Vascular Surgery

## 2018-07-04 NOTE — Telephone Encounter (Signed)
fmla paper work placed on [employer] 07/04/18.

## 2018-07-04 NOTE — Telephone Encounter (Signed)
Attempted to contact the patient to schedule his PD cath insertion. Left a message for a callback.

## 2018-07-05 ENCOUNTER — Other Ambulatory Visit (INDEPENDENT_AMBULATORY_CARE_PROVIDER_SITE_OTHER): Payer: Self-pay | Admitting: Vascular Surgery

## 2018-07-07 ENCOUNTER — Encounter
Admission: RE | Admit: 2018-07-07 | Discharge: 2018-07-07 | Disposition: A | Discharge status: Home / Self Care | Payer: Managed Care, Other (non HMO) | Source: Ambulatory Visit | Attending: Vascular Surgery | Admitting: Vascular Surgery

## 2018-07-07 ENCOUNTER — Other Ambulatory Visit: Payer: Self-pay

## 2018-07-07 DIAGNOSIS — Z01812 Encounter for preprocedural laboratory examination: Secondary | ICD-10-CM | POA: Diagnosis present

## 2018-07-07 DIAGNOSIS — Z0183 Encounter for blood typing: Secondary | ICD-10-CM | POA: Insufficient documentation

## 2018-07-07 HISTORY — DX: Chronic obstructive pulmonary disease, unspecified: J44.9

## 2018-07-07 HISTORY — DX: Acute myocardial infarction, unspecified: I21.9

## 2018-07-07 LAB — CBC WITH DIFFERENTIAL/PLATELET
BASOS ABS: 0.1 10*3/uL (ref 0–0.1)
Basophils Relative: 1 %
EOS PCT: 6 %
Eosinophils Absolute: 0.4 10*3/uL (ref 0–0.7)
HCT: 36.6 % — ABNORMAL LOW (ref 40.0–52.0)
Hemoglobin: 12.4 g/dL — ABNORMAL LOW (ref 13.0–18.0)
Lymphocytes Relative: 21 %
Lymphs Abs: 1.5 10*3/uL (ref 1.0–3.6)
MCH: 31.9 pg (ref 26.0–34.0)
MCHC: 33.9 g/dL (ref 32.0–36.0)
MCV: 94 fL (ref 80.0–100.0)
Monocytes Absolute: 0.8 10*3/uL (ref 0.2–1.0)
Monocytes Relative: 11 %
Neutro Abs: 4.5 10*3/uL (ref 1.4–6.5)
Neutrophils Relative %: 61 %
PLATELETS: 173 10*3/uL (ref 150–440)
RBC: 3.89 MIL/uL — AB (ref 4.40–5.90)
RDW: 13.2 % (ref 11.5–14.5)
WBC: 7.2 10*3/uL (ref 3.8–10.6)

## 2018-07-07 LAB — TYPE AND SCREEN
ABO/RH(D): A POS
ANTIBODY SCREEN: NEGATIVE

## 2018-07-07 LAB — BASIC METABOLIC PANEL
Anion gap: 6 (ref 5–15)
BUN: 53 mg/dL — AB (ref 6–20)
CALCIUM: 8.6 mg/dL — AB (ref 8.9–10.3)
CO2: 25 mmol/L (ref 22–32)
Chloride: 105 mmol/L (ref 98–111)
Creatinine, Ser: 4.32 mg/dL — ABNORMAL HIGH (ref 0.61–1.24)
GFR calc Af Amer: 16 mL/min — ABNORMAL LOW (ref >60)
GFR, EST NON AFRICAN AMERICAN: 14 mL/min — AB (ref >60)
Glucose, Bld: 105 mg/dL — ABNORMAL HIGH (ref 70–99)
Potassium: 4.6 mmol/L (ref 3.5–5.1)
SODIUM: 136 mmol/L (ref 135–145)

## 2018-07-07 LAB — PROTIME-INR
INR: 0.92
PROTHROMBIN TIME: 12.3 s (ref 11.4–15.2)

## 2018-07-07 LAB — APTT: APTT: 31 s (ref 24–36)

## 2018-07-07 NOTE — Pre-Procedure Instructions (Signed)
ECG 12-lead7/08/2018 [place] Component Name Value Ref Range  Vent Rate (bpm) 66   PR Interval (msec) 142   QRS Interval (msec) 100   QT Interval (msec) 416   QTc (msec) 436   Other Result Information  This result has an attachment that is not available.  Result Narrative  Normal sinus rhythm Normal ECG When compared with ECG of 22-Feb-2018 09:25, No significant change was found I reviewed and concur with this report. Electronically signed [id number] MD, Darnell Level [phone]) on 07/05/2018 1:47:05 PM

## 2018-07-07 NOTE — Patient Instructions (Addendum)
Your procedure is scheduled on: Friday, July 13, 2018  Report to [place] DO NOT STOP ON THE FIRST FLOOR TO REGISTER  To find out your arrival time please call [phone] between 1PM - 3PM on Thursday, July 31ST \ Remember: Instructions that are not followed completely may result in serious medical risk,  up to and including death, or upon the discretion of your surgeon and anesthesiologist your  surgery may need to be rescheduled.     _X__ 1. Do not eat food after midnight the night before your procedure.                 No gum chewing or hard candies.NOTHING SOLID IN YOUR MOUTH AFTER MIDNIGHT                  You may drink clear liquids up to 2 hours before you are scheduled to arrive for your surgery-                  DO not drink clear liquids within 2 hours of the start of your surgery.                  Clear Liquids include:  water, apple juice without pulp, clear carbohydrate                 drink such as Clearfast of Gatorade, Black Coffee or Tea (Do not add                 anything to coffee or tea).NO DAIRY OR CREAM PRODUCTS / NO SOUPS OR BROTH  __X__2.  On the morning of surgery brush your teeth with toothpaste and water,                  You may rinse your mouth with mouthwash if you wish.                    Do not swallow any toothpaste of mouthwash.     _X__ 3.  No Alcohol for 24 hours before or after surgery.   _X__ 4.  Do Not Smoke or use e-cigarettes For 24 Hours Prior to Your Surgery.                 Do not use any chewable tobacco products for at least 6 hours prior to                 surgery.  ____  5.  Bring all medications with you on the day of surgery if instructed.   ____  6.  Notify your doctor if there is any change in your medical condition      (cold, fever, infections).     Do not wear jewelry, make-up, hairpins, clips or nail polish. Do not wear lotions, powders, or perfumes. You may wear  deodorant. Do not shave 48 hours prior to surgery. Men may shave face and neck. Do not bring valuables to the hospital.    [facility] is not responsible for any belongings or valuables.  Contacts, dentures or bridgework may not be worn into surgery. Leave your suitcase in the car. After surgery it may be brought to your room. For patients admitted to the hospital, discharge time is determined by your treatment team.   Patients discharged the day of surgery will not be allowed to drive home.   Please read over the following fact sheets that you  were given:   PREPARING FOR SURGERY               MEDICAL DIRECTIVES   _X___ Take these medicines the morning of surgery with A SIP OF WATER:    1. HYDRALAZINE  2. SPIRIVA INHALER  3. CARVEDILOL (IF YOU TAKE AT NIGHT, THEN DO SO BUT DO NOT REPEAT IN THE MORNING OF SURGERY)  4. AMLODIPINE (IF YOU TAKE AT NIGHT, THEN DO SO BUT DO NOT REPEAT ON THE MORNING OF SURGERY)  5.  6.  ____ Fleet Enema (as directed)   _X___ Use CHG Soap as directed  _X___ Use inhalers on the day of surgery  __X__ Stop ASPIRIN AND ASPIRIN PRODUCTS AS OF TODAY              THIS INCLUDES EXCEDRIN   _X___ Stop Anti-inflammatories AS OF TODAY                 THIS INCLUDES IBUPROFEN / MOTRIN / ADVIL / ALEVE                  YOU MAY TAKE TYLENOL AT ANY TIME PRIOR TO SURGERY    ____ Stop supplements until after surgery.    ____ Bring C-Pap to the hospital.   TAKE ALL YOUR NIGHT TIME MEDICATIONS AS USUAL  BRING POA / MEDICAL DIRECTIVES PAPERWORK TO [facility] IF IT IS FILLED OUT.  WEAR LOOSE FITTING CLOTHES ON DAY OF SURGERY  CONTACT [employer] TO ARRANGE TRANSPORTATION AND OVERNIGHT ASSISTANCE  PLEASE CALL [phone] WITH UPDATES ON YOUR MEDICATION INFORMATION.

## 2018-07-08 NOTE — Pre-Procedure Instructions (Signed)
Met B and CBC results sent to Anesthesia and Dr. Delana Meyer for review.

## 2018-07-10 ENCOUNTER — Telehealth (INDEPENDENT_AMBULATORY_CARE_PROVIDER_SITE_OTHER): Payer: Self-pay

## 2018-07-10 NOTE — Telephone Encounter (Signed)
Patient stated that he received a call from you this morning and that he is returning your call. He didn't say what it was pertaning.

## 2018-07-10 NOTE — Telephone Encounter (Signed)
I attempted to call the patient back regarding his FMLA paperwork. Per Dr. Delana Meyer we would only take him out of work for 2 weeks and due to the nature of his surgery and it being ESRD he may need his nephrologist to fill out his FMLA paperwork.

## 2018-07-11 ENCOUNTER — Telehealth (INDEPENDENT_AMBULATORY_CARE_PROVIDER_SITE_OTHER): Payer: Self-pay

## 2018-07-11 MED ORDER — CEFAZOLIN SODIUM-DEXTROSE 2-4 GM/100ML-% IV SOLN
2.0000 g | INTRAVENOUS | Status: AC
  Administered 2018-07-12: 2 g via INTRAVENOUS

## 2018-07-11 NOTE — Telephone Encounter (Signed)
Patient was returning a call from yesterday regarding to his FMLA paperwork and I left a message on his voicemail from the information from prior note

## 2018-07-12 ENCOUNTER — Ambulatory Visit: Payer: Managed Care, Other (non HMO) | Admitting: Anesthesiology

## 2018-07-12 ENCOUNTER — Ambulatory Visit
Admission: RE | Admit: 2018-07-12 | Discharge: 2018-07-12 | Disposition: A | Discharge status: Home / Self Care | Payer: Managed Care, Other (non HMO) | Source: Ambulatory Visit | Attending: Vascular Surgery | Admitting: Vascular Surgery

## 2018-07-12 ENCOUNTER — Other Ambulatory Visit: Payer: Self-pay

## 2018-07-12 ENCOUNTER — Encounter: Payer: Self-pay | Admitting: Vascular Surgery

## 2018-07-12 ENCOUNTER — Encounter
Admission: RE | Disposition: A | Discharge status: Home / Self Care | Payer: Self-pay | Source: Ambulatory Visit | Attending: Vascular Surgery

## 2018-07-12 DIAGNOSIS — Z79899 Other long term (current) drug therapy: Secondary | ICD-10-CM | POA: Insufficient documentation

## 2018-07-12 DIAGNOSIS — M109 Gout, unspecified: Secondary | ICD-10-CM | POA: Insufficient documentation

## 2018-07-12 DIAGNOSIS — N185 Chronic kidney disease, stage 5: Secondary | ICD-10-CM | POA: Diagnosis not present

## 2018-07-12 DIAGNOSIS — I2510 Atherosclerotic heart disease of native coronary artery without angina pectoris: Secondary | ICD-10-CM | POA: Insufficient documentation

## 2018-07-12 DIAGNOSIS — I120 Hypertensive chronic kidney disease with stage 5 chronic kidney disease or end stage renal disease: Secondary | ICD-10-CM | POA: Diagnosis not present

## 2018-07-12 DIAGNOSIS — F17210 Nicotine dependence, cigarettes, uncomplicated: Secondary | ICD-10-CM | POA: Diagnosis not present

## 2018-07-12 DIAGNOSIS — Z7982 Long term (current) use of aspirin: Secondary | ICD-10-CM | POA: Insufficient documentation

## 2018-07-12 DIAGNOSIS — E7800 Pure hypercholesterolemia, unspecified: Secondary | ICD-10-CM | POA: Diagnosis not present

## 2018-07-12 DIAGNOSIS — G4700 Insomnia, unspecified: Secondary | ICD-10-CM | POA: Diagnosis not present

## 2018-07-12 HISTORY — PX: CAPD INSERTION: SHX5233

## 2018-07-12 SURGERY — LAPAROSCOPIC INSERTION CONTINUOUS AMBULATORY PERITONEAL DIALYSIS  (CAPD) CATHETER
Anesthesia: General | Wound class: Clean

## 2018-07-12 MED ORDER — PROPOFOL 10 MG/ML IV BOLUS
INTRAVENOUS | Status: DC | PRN
  Administered 2018-07-12: 200 mg via INTRAVENOUS

## 2018-07-12 MED ORDER — ONDANSETRON HCL 4 MG/2ML IJ SOLN
INTRAMUSCULAR | Status: DC | PRN
  Administered 2018-07-12: 4 mg via INTRAVENOUS

## 2018-07-12 MED ORDER — ROCURONIUM BROMIDE 50 MG/5ML IV SOLN
INTRAVENOUS | Status: AC
  Filled 2018-07-12: qty 1

## 2018-07-12 MED ORDER — IPRATROPIUM-ALBUTEROL 0.5-2.5 (3) MG/3ML IN SOLN
RESPIRATORY_TRACT | Status: AC
  Filled 2018-07-12: qty 3

## 2018-07-12 MED ORDER — BUPIVACAINE-EPINEPHRINE (PF) 0.25% -1:200000 IJ SOLN
INTRAMUSCULAR | Status: DC | PRN
  Administered 2018-07-12: 30 mL

## 2018-07-12 MED ORDER — DEXAMETHASONE SODIUM PHOSPHATE 10 MG/ML IJ SOLN
INTRAMUSCULAR | Status: DC | PRN
  Administered 2018-07-12: 5 mg via INTRAVENOUS

## 2018-07-12 MED ORDER — FENTANYL CITRATE (PF) 100 MCG/2ML IJ SOLN
INTRAMUSCULAR | Status: AC
  Filled 2018-07-12: qty 2

## 2018-07-12 MED ORDER — FAMOTIDINE 20 MG PO TABS
20.0000 mg | ORAL_TABLET | Freq: Once | ORAL | Status: AC
  Administered 2018-07-12: 20 mg via ORAL

## 2018-07-12 MED ORDER — SODIUM CHLORIDE 0.9 % IV SOLN
INTRAVENOUS | Status: DC
  Administered 2018-07-12: 11:00:00 via INTRAVENOUS

## 2018-07-12 MED ORDER — LIDOCAINE HCL (PF) 2 % IJ SOLN
INTRAMUSCULAR | Status: AC
  Filled 2018-07-12: qty 10

## 2018-07-12 MED ORDER — CHLORHEXIDINE GLUCONATE CLOTH 2 % EX PADS: 6.0000 | MEDICATED_PAD | Freq: Once | CUTANEOUS | Status: DC

## 2018-07-12 MED ORDER — PHENYLEPHRINE HCL 10 MG/ML IJ SOLN
INTRAMUSCULAR | Status: AC
  Filled 2018-07-12: qty 1

## 2018-07-12 MED ORDER — MIDAZOLAM HCL 2 MG/2ML IJ SOLN
INTRAMUSCULAR | Status: AC
  Filled 2018-07-12: qty 2

## 2018-07-12 MED ORDER — IPRATROPIUM-ALBUTEROL 0.5-2.5 (3) MG/3ML IN SOLN
3.0000 mL | Freq: Once | RESPIRATORY_TRACT | Status: AC
  Administered 2018-07-12: 3 mL via RESPIRATORY_TRACT

## 2018-07-12 MED ORDER — SUCCINYLCHOLINE CHLORIDE 20 MG/ML IJ SOLN
INTRAMUSCULAR | Status: DC | PRN
  Administered 2018-07-12: 120 mg via INTRAVENOUS

## 2018-07-12 MED ORDER — LIDOCAINE HCL (CARDIAC) PF 100 MG/5ML IV SOSY
PREFILLED_SYRINGE | INTRAVENOUS | Status: DC | PRN
  Administered 2018-07-12: 100 mg via INTRAVENOUS

## 2018-07-12 MED ORDER — ROCURONIUM BROMIDE 100 MG/10ML IV SOLN
INTRAVENOUS | Status: DC | PRN
  Administered 2018-07-12: 5 mg via INTRAVENOUS
  Administered 2018-07-12: 25 mg via INTRAVENOUS

## 2018-07-12 MED ORDER — SUCCINYLCHOLINE CHLORIDE 20 MG/ML IJ SOLN
INTRAMUSCULAR | Status: AC
  Filled 2018-07-12: qty 1

## 2018-07-12 MED ORDER — BUPIVACAINE-EPINEPHRINE (PF) 0.25% -1:200000 IJ SOLN
INTRAMUSCULAR | Status: AC
  Filled 2018-07-12: qty 30

## 2018-07-12 MED ORDER — CEFAZOLIN SODIUM-DEXTROSE 2-4 GM/100ML-% IV SOLN
INTRAVENOUS | Status: AC
  Filled 2018-07-12: qty 100

## 2018-07-12 MED ORDER — FENTANYL CITRATE (PF) 100 MCG/2ML IJ SOLN
INTRAMUSCULAR | Status: DC | PRN
  Administered 2018-07-12 (×2): 50 ug via INTRAVENOUS

## 2018-07-12 MED ORDER — ONDANSETRON HCL 4 MG/2ML IJ SOLN: 4.0000 mg | Freq: Once | INTRAMUSCULAR | Status: DC | PRN

## 2018-07-12 MED ORDER — SUGAMMADEX SODIUM 200 MG/2ML IV SOLN
INTRAVENOUS | Status: DC | PRN
  Administered 2018-07-12: 200 mg via INTRAVENOUS

## 2018-07-12 MED ORDER — PHENYLEPHRINE HCL 10 MG/ML IJ SOLN
INTRAMUSCULAR | Status: DC | PRN
  Administered 2018-07-12 (×4): 200 ug via INTRAVENOUS
  Administered 2018-07-12: 100 ug via INTRAVENOUS

## 2018-07-12 MED ORDER — EPHEDRINE SULFATE 50 MG/ML IJ SOLN
INTRAMUSCULAR | Status: DC | PRN
  Administered 2018-07-12 (×3): 10 mg via INTRAVENOUS

## 2018-07-12 MED ORDER — ONDANSETRON HCL 4 MG/2ML IJ SOLN
INTRAMUSCULAR | Status: AC
  Filled 2018-07-12: qty 2

## 2018-07-12 MED ORDER — MIDAZOLAM HCL 2 MG/2ML IJ SOLN
INTRAMUSCULAR | Status: DC | PRN
  Administered 2018-07-12: 2 mg via INTRAVENOUS

## 2018-07-12 MED ORDER — FAMOTIDINE 20 MG PO TABS
ORAL_TABLET | ORAL | Status: AC
  Filled 2018-07-12: qty 1

## 2018-07-12 MED ORDER — SODIUM CHLORIDE 0.9 % IV SOLN
INTRAVENOUS | Status: DC | PRN
  Administered 2018-07-12: 35 ug/min via INTRAVENOUS

## 2018-07-12 MED ORDER — EPHEDRINE SULFATE 50 MG/ML IJ SOLN
INTRAMUSCULAR | Status: AC
  Filled 2018-07-12: qty 1

## 2018-07-12 MED ORDER — FENTANYL CITRATE (PF) 100 MCG/2ML IJ SOLN: 25.0000 ug | INTRAMUSCULAR | Status: DC | PRN

## 2018-07-12 MED ORDER — DEXAMETHASONE SODIUM PHOSPHATE 10 MG/ML IJ SOLN
INTRAMUSCULAR | Status: AC
  Filled 2018-07-12: qty 1

## 2018-07-12 MED ORDER — HYDROCODONE-ACETAMINOPHEN 5-325 MG PO TABS: 1.0000 | ORAL_TABLET | Freq: Four times a day (QID) | ORAL | 0 refills | Status: DC | PRN

## 2018-07-12 SURGICAL SUPPLY — 38 items
ADAPTER BETA CAP QUINTON DIALY (ADAPTER) ×3 IMPLANT
ADAPTER CATH DIALYSIS 18.75 (CATHETERS) ×2 IMPLANT
ADAPTER CATH DIALYSIS 18.75CM (CATHETERS) ×1
BLADE SURG 15 STRL LF DISP TIS (BLADE) ×1 IMPLANT
BLADE SURG 15 STRL SS (BLADE) ×2
CANISTER SUCT 1200ML W/VALVE (MISCELLANEOUS) ×3 IMPLANT
CATH DLYS SWAN NECK 62.5CM (CATHETERS) ×6 IMPLANT
CHLORAPREP W/TINT 26ML (MISCELLANEOUS) ×3 IMPLANT
DERMABOND ADVANCED (GAUZE/BANDAGES/DRESSINGS) ×2
DERMABOND ADVANCED .7 DNX12 (GAUZE/BANDAGES/DRESSINGS) ×1 IMPLANT
DRSG TEGADERM 4X4.75 (GAUZE/BANDAGES/DRESSINGS) ×6 IMPLANT
ELECT REM PT RETURN 9FT ADLT (ELECTROSURGICAL) ×3
ELECTRODE REM PT RTRN 9FT ADLT (ELECTROSURGICAL) ×1 IMPLANT
GAUZE SPONGE 4X4 12PLY STRL (GAUZE/BANDAGES/DRESSINGS) ×3 IMPLANT
GLOVE BIO SURGEON STRL SZ7 (GLOVE) ×3 IMPLANT
GLOVE INDICATOR 7.5 STRL GRN (GLOVE) ×3 IMPLANT
GLOVE SURG SYN 8.0 (GLOVE) ×3 IMPLANT
GOWN STRL REUS W/ TWL LRG LVL3 (GOWN DISPOSABLE) ×2 IMPLANT
GOWN STRL REUS W/ TWL XL LVL3 (GOWN DISPOSABLE) ×1 IMPLANT
GOWN STRL REUS W/TWL LRG LVL3 (GOWN DISPOSABLE) ×4
GOWN STRL REUS W/TWL XL LVL3 (GOWN DISPOSABLE) ×2
KIT TURNOVER KIT A (KITS) ×3 IMPLANT
LABEL OR SOLS (LABEL) ×3 IMPLANT
MINICAP W/POVIDONE IODINE SOL (MISCELLANEOUS) ×3 IMPLANT
NEEDLE HYPO 25X1 1.5 SAFETY (NEEDLE) ×3 IMPLANT
NEEDLE INSUFFLATION 150MM (ENDOMECHANICALS) ×3 IMPLANT
NS IRRIG 500ML POUR BTL (IV SOLUTION) ×3 IMPLANT
PACK LAP CHOLECYSTECTOMY (MISCELLANEOUS) ×3 IMPLANT
PENCIL ELECTRO HAND CTR (MISCELLANEOUS) ×3 IMPLANT
SET TRANSFER 6 W/TWIST CLAMP 5 (SET/KITS/TRAYS/PACK) ×3 IMPLANT
SLEEVE ENDOPATH XCEL 5M (ENDOMECHANICALS) IMPLANT
SUT MNCRL AB 4-0 PS2 18 (SUTURE) ×3 IMPLANT
SUT VIC AB 3-0 SH 27 (SUTURE) ×4
SUT VIC AB 3-0 SH 27X BRD (SUTURE) ×2 IMPLANT
SUT VICRYL 0 AB UR-6 (SUTURE) ×6 IMPLANT
SYR 50ML LL SCALE MARK (SYRINGE) ×3 IMPLANT
TROCAR XCEL NON-BLD 5MMX100MML (ENDOMECHANICALS) ×3 IMPLANT
TUBING INSUFFLATION (TUBING) ×3 IMPLANT

## 2018-07-12 NOTE — Discharge Instructions (Signed)

## 2018-07-12 NOTE — Transfer of Care (Deleted)
Immediate Anesthesia Transfer of Care Note  Patient: Willie Hahn  Procedure(s) Performed:   Patient Location:   Anesthesia Type:  Level of Consciousness: drowsy and patient cooperative  Airway & Oxygen Therapy: Patient Spontanous Breathing and Patient connected to face mask oxygen  Post-op Assessment: Report given to RN and Post -op Vital signs reviewed and stable  Post vital signs: Reviewed and stable  Last Vitals:  Vitals Value Taken Time  BP    Temp    Pulse    Resp    SpO2      Last Pain:  Vitals:   07/12/18 0956  TempSrc: Temporal  PainSc: 0-No pain         Complications: No apparent anesthesia complications

## 2018-07-12 NOTE — Anesthesia Post-op Follow-up Note (Incomplete)
Anesthesia QCDR form completed.        

## 2018-07-12 NOTE — H&P (Signed)
[facility] History & Physical Update  The patient was interviewed and re-examined.  The patient's previous History and Physical has been reviewed and is unchanged.  There is no change in the plan of care. We plan to proceed with the scheduled procedure.  Hortencia Pilar, MD  07/12/2018, 10:18 AM

## 2018-07-12 NOTE — Anesthesia Post-op Follow-up Note (Signed)
Anesthesia QCDR form completed.        

## 2018-07-12 NOTE — Anesthesia Procedure Notes (Signed)
Procedure Name: Intubation Date/Time: 07/12/2018 1:58 PM Performed by: Christain Sacramento, RN Pre-anesthesia Checklist: Patient identified, Emergency Drugs available, Suction available, Patient being monitored and Timeout performed Patient Re-evaluated:Patient Re-evaluated prior to induction Oxygen Delivery Method: Circle system utilized Preoxygenation: Pre-oxygenation with 100% oxygen Induction Type: IV induction Ventilation: Mask ventilation without difficulty and Oral airway inserted - appropriate to patient size Laryngoscope Size: Mac and 4 Grade View: Grade I Tube type: Oral Tube size: 7.5 mm Number of attempts: 1 Airway Equipment and Method: Stylet Placement Confirmation: ETT inserted through vocal cords under direct vision,  positive ETCO2 and breath sounds checked- equal and bilateral Secured at: 23 cm Tube secured with: Tape Dental Injury: Teeth and Oropharynx as per pre-operative assessment

## 2018-07-12 NOTE — Transfer of Care (Signed)
Immediate Anesthesia Transfer of Care Note  Patient: MCCAULEY DIEHL  Procedure(s) Performed: LAPAROSCOPIC INSERTION CONTINUOUS AMBULATORY PERITONEAL DIALYSIS  (CAPD) CATHETER (N/A )  Patient Location: PACU  Anesthesia Type:General  Level of Consciousness: drowsy  Airway & Oxygen Therapy: Patient Spontanous Breathing and Patient connected to face mask oxygen  Post-op Assessment: Report given to RN and Post -op Vital signs reviewed and stable  Post vital signs: Reviewed and stable  Last Vitals:  Vitals Value Taken Time  BP 139/73 07/12/2018  3:01 PM  Temp 36.7 C 07/12/2018  3:00 PM  Pulse 65 07/12/2018  3:03 PM  Resp 14 07/12/2018  3:03 PM  SpO2 99 % 07/12/2018  3:03 PM  Vitals shown include unvalidated device data.  Last Pain:  Vitals:   07/12/18 0956  TempSrc: Temporal  PainSc: 0-No pain         Complications: No apparent anesthesia complications

## 2018-07-12 NOTE — Op Note (Signed)
  OPERATIVE NOTE   PROCEDURE: 1. Laparoscopic peritoneal dialysis catheter placement.  PRE-OPERATIVE DIAGNOSIS: 1. Stage V renal insufficiency 2. Hypertension  POST-OPERATIVE DIAGNOSIS: Same  SURGEON: Hortencia Pilar  ASSISTANT(S): None  ANESTHESIA: general  ESTIMATED BLOOD LOSS: Minimal   FINDING(S): 1. None  SPECIMEN(S): None  INDICATIONS:  Willie Hahn is a 62 y.o. male who presents with stage V renal insufficiency. The patient has decided to do peritoneal dialysis for his long-term dialysis. Risks and benefits of placement were discussed and he is agreeable to proceed.  Differences between peritoneal dialysis and hemodialysis were discussed.    DESCRIPTION:  After obtaining full informed written consent, the patient was brought back to the operating room and placed supine upon the operating table. The patient received IV antibiotics prior to induction. After obtaining adequate anesthesia, the abdomen was prepped and draped in the standard fashion.   A small vertical incision was made in the midline just above the umbilicus and the dissection was carried down through the soft tissue to expose the fascia. Two 0 Vicryl sutures were then used to secure the fascia just lateral to the midline on both sides and an 15 blade was used to incise the fascia. The fascia is then elevated with a bone hook Varies needle is introduced and a saline test was performed indicating intraperitoneal location and insufflated of the abdomen with carbon dioxide is performed to 15 mmHg pressure. A 5 mm trocar is then placed again maintaining elevation of the fascia with a bone hook.  A oblique incision is then made in the left lower quadrant to the lateral portion of the rectus muscle the dissection is carried down through the soft tissues and the fascia is exposed. Small incision is made in the fascia with the Bovie and a pursestring suture of 0 Vicryl is placed. Upon initial attempt of placing the  Seldinger needle the adhesions are obstructing the catheter positioning and view of the placement and therefore they must be removed.  Seldinger needle was then inserted under direct visualization and the wire introduced under the view of the camera trocar was then placed and the catheter was advanced into the abdominal cavity over the trocar. Under direct visualization the catheters curled portion was positioned deep into the pelvis just to the right of midline. It was irrigated with heparinized saline. The deep cuff was secured to the fascial pursestring suture. A small counterincision was made in the right upper quadrant of the abdomen and the catheter was brought out this site. The appropriate distal connectors were placed, and I then placed 300 cc of saline through the catheter into the pelvis. The abdomen was desufflated. Immediately, 250 cc of effluent returned through the catheter when the bag was placed to gravity. I took one more look with the camera to ensure that the catheter was in the pelvis and it was. The hasson trocar was then removed. I then closed the incisions with a 2-0 Vicryl in the right lower quadrant incision and subsequently 3-0 Vicryl and 4-0 Monocryl, the other incisions were closed with 3-0 Vicryl and 4-0 Monocryl and placed Dermabond as dressing. Dry dressing was placed around the catheter exit site. The patient was then awakened from anesthesia and taken to the recovery room in stable condition having tolerated the procedure well.   COMPLICATIONS: None  CONDITION: None  Hortencia Pilar 07/12/2018, 3:01 PM

## 2018-07-12 NOTE — Anesthesia Preprocedure Evaluation (Addendum)
Anesthesia Evaluation  Patient identified by MRN, date of birth, ID band Patient awake    Reviewed: Allergy & Precautions, NPO status , Patient's Chart, lab work & pertinent test results  History of Anesthesia Complications Negative for: history of anesthetic complications  Airway Mallampati: II       Dental  (+) Lower Dentures, Upper Dentures   Pulmonary neg sleep apnea, COPD,  COPD inhaler, Current Smoker,           Cardiovascular hypertension, Pt. on medications + Past MI  (-) CHF (-) dysrhythmias (-) Valvular Problems/Murmurs     Neuro/Psych neg Seizures    GI/Hepatic GERD  Medicated,(+) Hepatitis -, C  Endo/Other  neg diabetes  Renal/GU ESRFRenal disease     Musculoskeletal   Abdominal   Peds  Hematology   Anesthesia Other Findings   Reproductive/Obstetrics                            Anesthesia Physical Anesthesia Plan  ASA: IV  Anesthesia Plan: General   Post-op Pain Management:    Induction: Intravenous  PONV Risk Score and Plan: 1  Airway Management Planned: Oral ETT  Additional Equipment:   Intra-op Plan:   Post-operative Plan:   Informed Consent: I have reviewed the patients History and Physical, chart, labs and discussed the procedure including the risks, benefits and alternatives for the proposed anesthesia with the patient or authorized representative who has indicated his/her understanding and acceptance.     Plan Discussed with:   Anesthesia Plan Comments:         Anesthesia Quick Evaluation

## 2018-07-13 NOTE — Anesthesia Postprocedure Evaluation (Signed)
Anesthesia Post Note  Patient: Willie Hahn  Procedure(s) Performed: LAPAROSCOPIC INSERTION CONTINUOUS AMBULATORY PERITONEAL DIALYSIS  (CAPD) CATHETER (N/A )  Patient location during evaluation: PACU Anesthesia Type: General Level of consciousness: awake and alert Pain management: pain level controlled Vital Signs Assessment: post-procedure vital signs reviewed and stable Respiratory status: spontaneous breathing and respiratory function stable Cardiovascular status: stable Anesthetic complications: no     Last Vitals:  Vitals:   07/12/18 1614 07/12/18 1630  BP: (!) 141/76 137/72  Pulse: (!) 58 (!) 58  Resp: 16 16  Temp: (!) 36.1 C   SpO2: 93% 94%    Last Pain:  Vitals:   07/12/18 1630  TempSrc:   PainSc: 0-No pain                 KEPHART,WILLIAM K

## 2018-07-17 ENCOUNTER — Telehealth (INDEPENDENT_AMBULATORY_CARE_PROVIDER_SITE_OTHER): Payer: Self-pay

## 2018-07-17 NOTE — Telephone Encounter (Signed)
Patient called to see if fmla papers were ready.I instructed the patient with the information from prior note for him to have his nephrologist to fill out the paper because of the surgery that he was having and that [employer] would only take him out of work for two weeks but Civil engineer, contracting was giving the forms

## 2018-07-31 ENCOUNTER — Ambulatory Visit (INDEPENDENT_AMBULATORY_CARE_PROVIDER_SITE_OTHER): Payer: Managed Care, Other (non HMO) | Admitting: Vascular Surgery

## 2018-07-31 ENCOUNTER — Encounter (INDEPENDENT_AMBULATORY_CARE_PROVIDER_SITE_OTHER): Payer: Self-pay | Admitting: Vascular Surgery

## 2018-07-31 VITALS — BP 111/65 | HR 57 | Resp 14 | Ht 66.0 in | Wt 188.0 lb

## 2018-07-31 DIAGNOSIS — Z992 Dependence on renal dialysis: Secondary | ICD-10-CM

## 2018-07-31 DIAGNOSIS — N186 End stage renal disease: Secondary | ICD-10-CM
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
## 2018-08-02 ENCOUNTER — Ambulatory Visit: Payer: Managed Care, Other (non HMO) | Admitting: Family Medicine

## 2018-08-02 ENCOUNTER — Encounter: Payer: Self-pay | Admitting: Family Medicine

## 2018-08-02 VITALS — BP 140/66 | HR 71 | Temp 97.7°F | Resp 16 | Ht 66.0 in | Wt 189.0 lb

## 2018-08-02 DIAGNOSIS — N186 End stage renal disease: Secondary | ICD-10-CM | POA: Diagnosis not present

## 2018-08-02 DIAGNOSIS — I2510 Atherosclerotic heart disease of native coronary artery without angina pectoris: Secondary | ICD-10-CM | POA: Diagnosis not present

## 2018-08-02 DIAGNOSIS — F17200 Nicotine dependence, unspecified, uncomplicated: Secondary | ICD-10-CM

## 2018-08-02 DIAGNOSIS — N051 Unspecified nephritic syndrome with focal and segmental glomerular lesions: Secondary | ICD-10-CM | POA: Insufficient documentation

## 2018-08-02 DIAGNOSIS — Z992 Dependence on renal dialysis: Secondary | ICD-10-CM

## 2018-08-02 MED ORDER — VARENICLINE TARTRATE 0.5 MG X 11 & 1 MG X 42 PO MISC: ORAL | 0 refills | Status: DC

## 2018-08-05 ENCOUNTER — Encounter (INDEPENDENT_AMBULATORY_CARE_PROVIDER_SITE_OTHER): Payer: Self-pay | Admitting: Vascular Surgery

## 2018-08-24 ENCOUNTER — Other Ambulatory Visit: Payer: Self-pay | Admitting: Family Medicine

## 2018-08-24 NOTE — Telephone Encounter (Signed)
Please check with patient and make he is taking chantix that was prescribed a month ago. If dong well can send in prescription for Chantix Continuing Month Pack.

## 2018-08-24 NOTE — Telephone Encounter (Signed)
Pt states he is not taking the medication b/c of the cost.  States it was going to be $400.  Wanted to know if there was a coupon or something to help with the cost.

## 2018-08-24 NOTE — Telephone Encounter (Signed)
Tried calling patient. Left message to call back. 

## 2018-12-05 IMAGING — US US RENAL
1 series · 14 of 25 positions shown · non-contrast
Comparison: None.

CLINICAL DATA: Stage four chronic kidney disease.

EXAM:
RENAL / URINARY TRACT ULTRASOUND COMPLETE

[Series 1: us renal · 0.24mm/px · 14 of 50 slices shown]
[im 1/50]
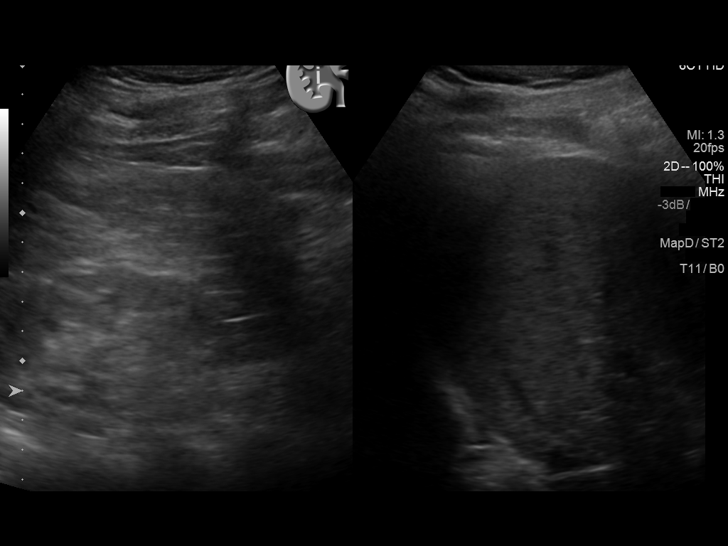
[im 5/50]
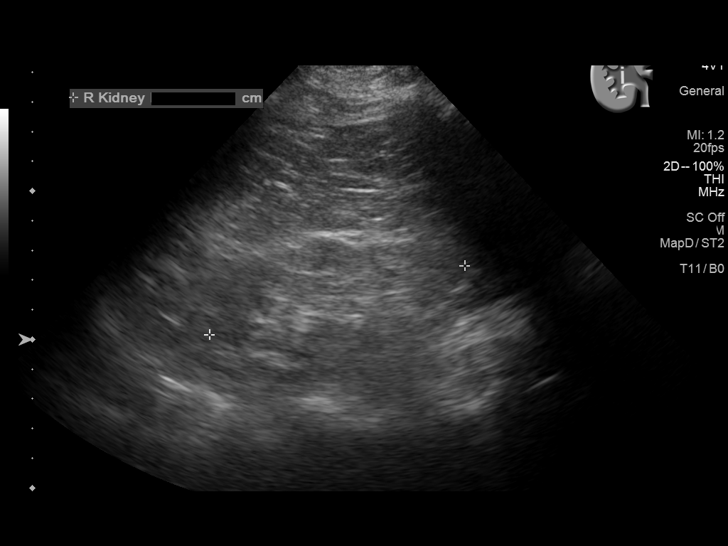
[im 9/50]
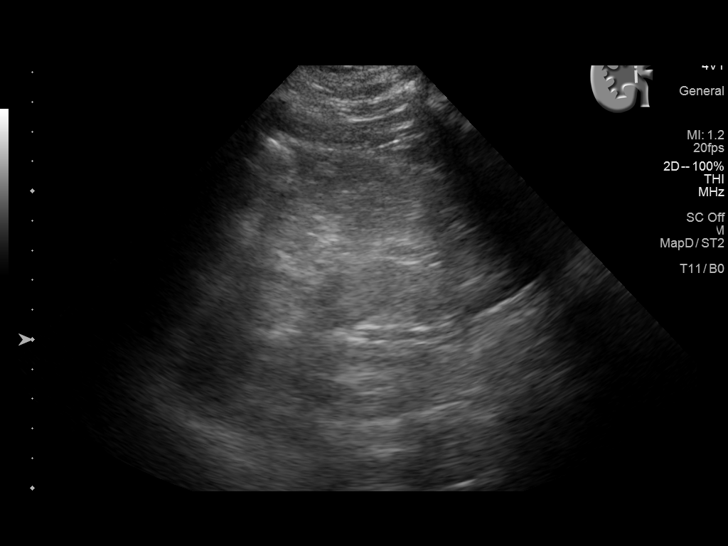
[im 13/50]
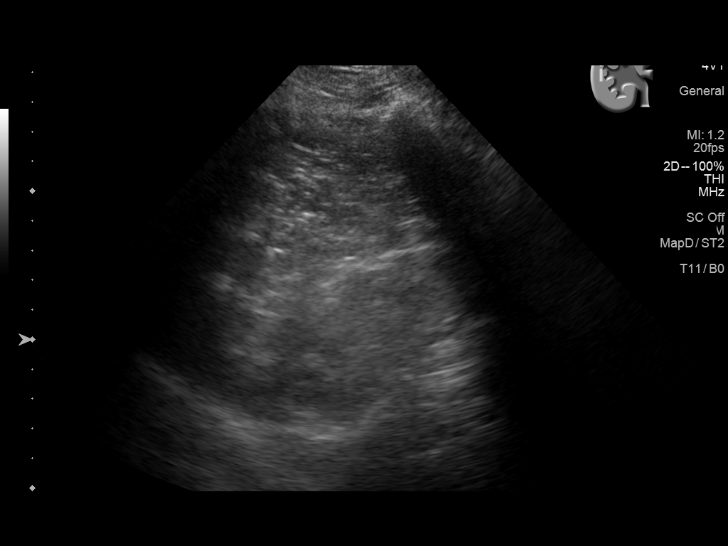
[im 17/50]
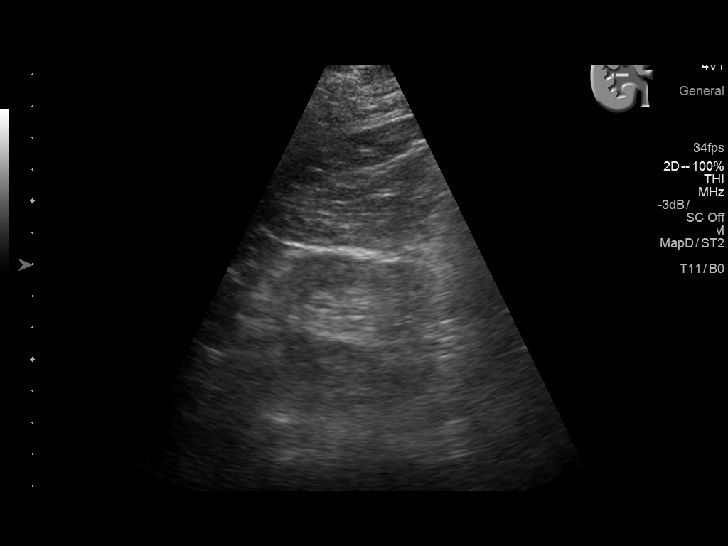
[im 19/50]
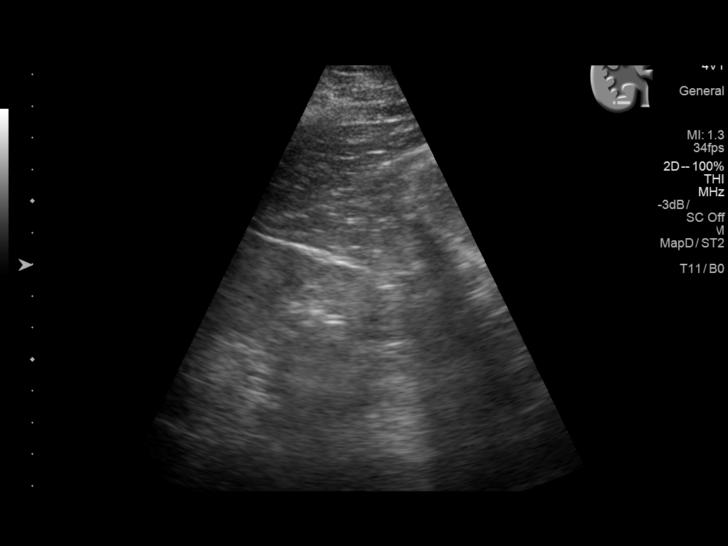
[im 23/50]
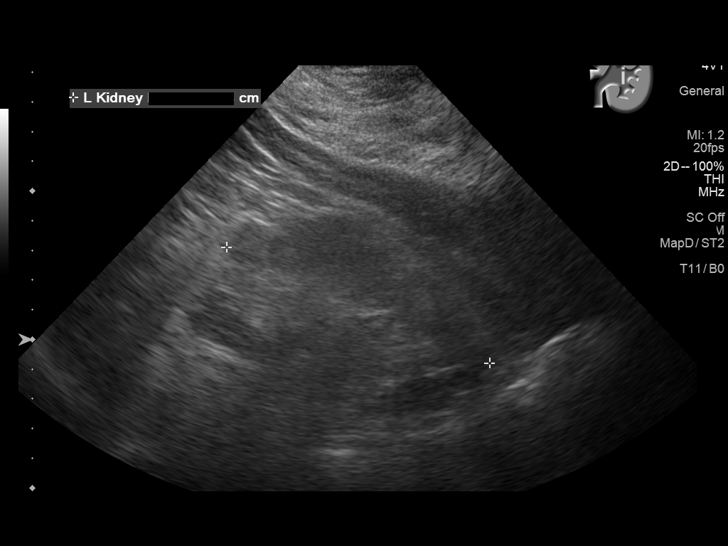
[im 27/50]
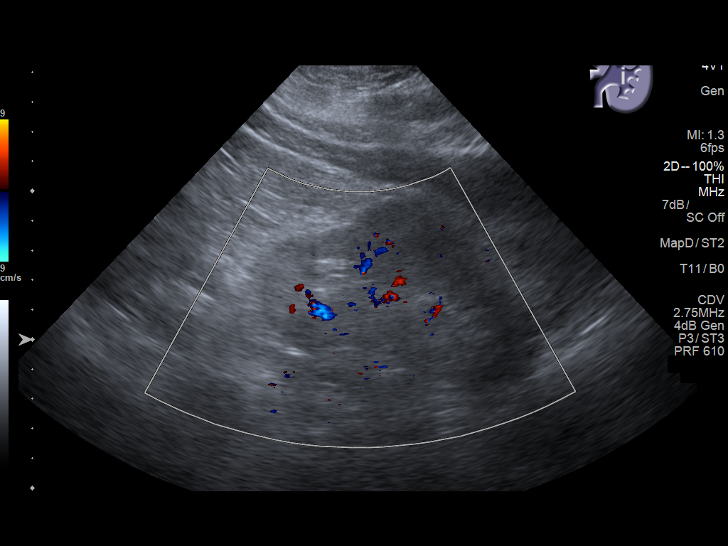
[im 31/50]
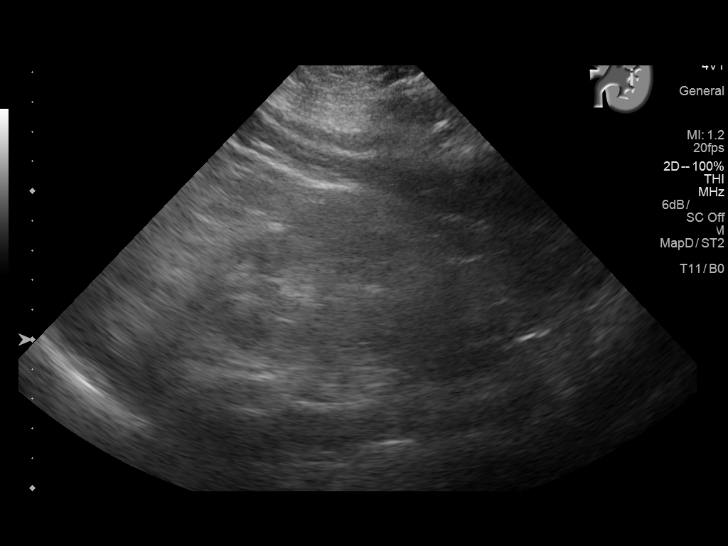
[im 33/50]
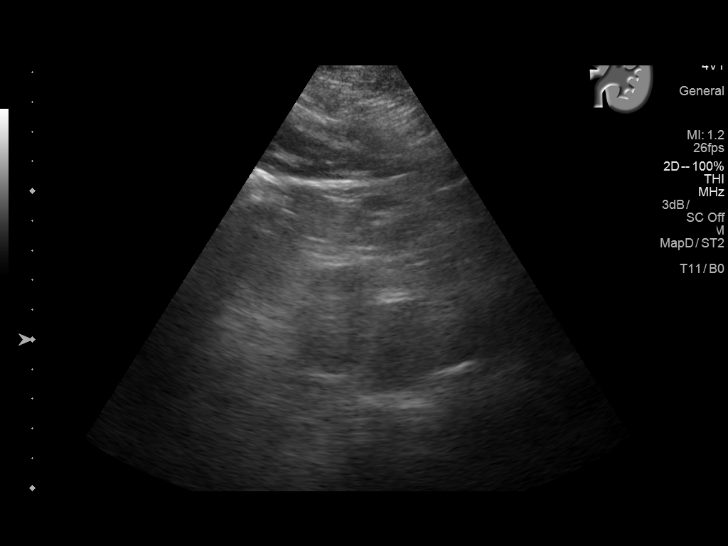
[im 37/50]
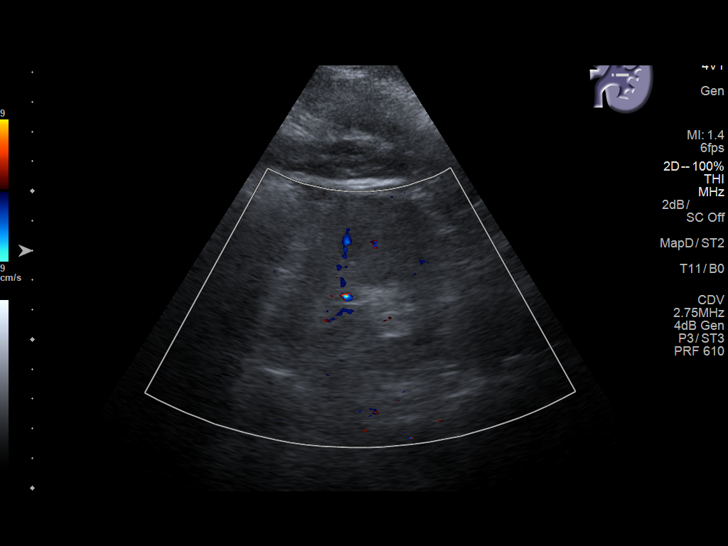
[im 41/50]
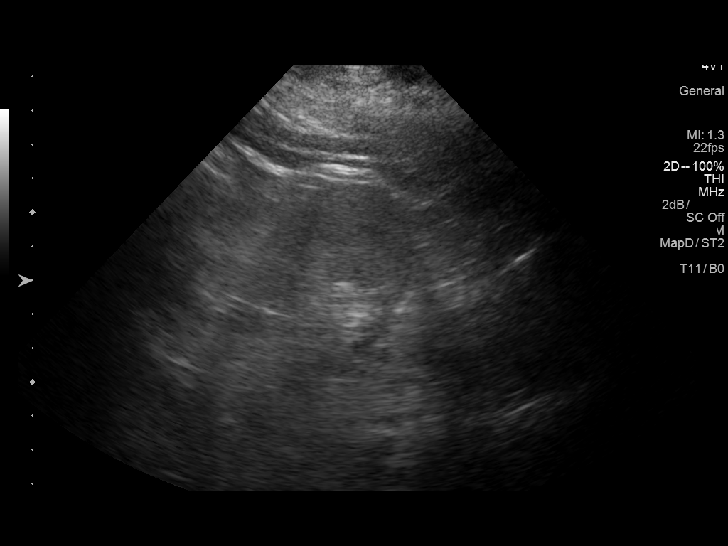
[im 45/50]
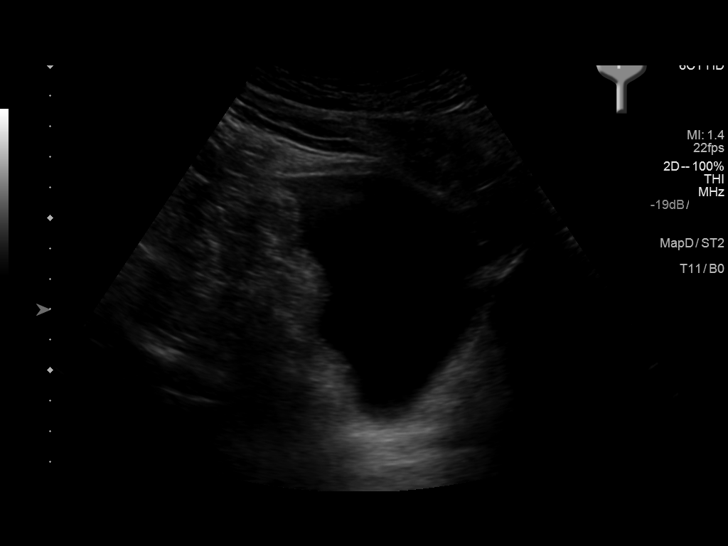
[im 50/50]
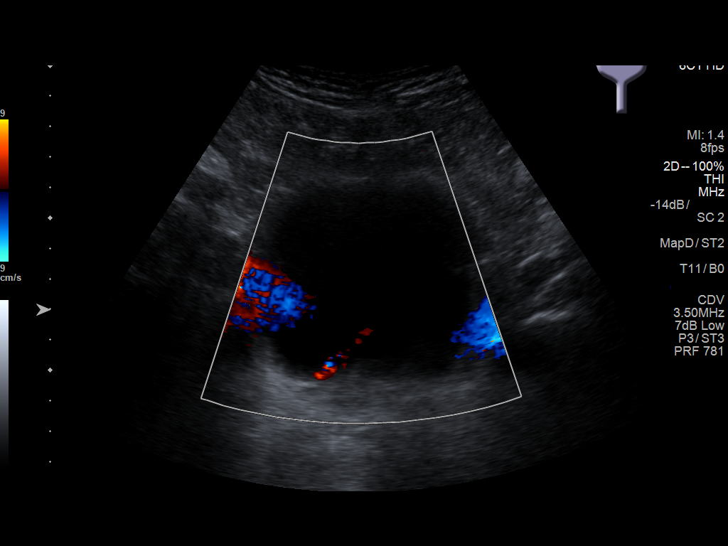

[14 of 25 positions shown; findings below may reference images not displayed]

FINDINGS: Right Kidney:

Length: 8.9 cm. Thin cortex with increased echogenicity. No
hydronephrosis, mass, cyst or shadowing stone.

Left Kidney:

Length: 10.3 cm. Thin cortex with increased echogenicity. No
hydronephrosis, mass, cyst or shadowing stone.

Bladder:

Normal appearance.  Bilateral ureteral jets identified.
IMPRESSION: Slightly small kidneys with slightly thin cortex showing increased
echogenicity. No obstruction or focal lesion.

## 2020-05-16 ENCOUNTER — Other Ambulatory Visit: Payer: Self-pay

## 2020-05-16 ENCOUNTER — Ambulatory Visit (INDEPENDENT_AMBULATORY_CARE_PROVIDER_SITE_OTHER): Payer: Managed Care, Other (non HMO) | Admitting: Physician Assistant

## 2020-05-16 ENCOUNTER — Encounter: Payer: Self-pay | Admitting: Physician Assistant

## 2020-05-16 VITALS — BP 107/68 | HR 69 | Temp 97.0°F | Resp 16 | Wt 186.4 lb

## 2020-05-16 DIAGNOSIS — M10372 Gout due to renal impairment, left ankle and foot: Secondary | ICD-10-CM | POA: Diagnosis not present

## 2020-05-16 MED ORDER — COLCHICINE 0.6 MG PO TABS: ORAL_TABLET | ORAL | 3 refills | Status: AC

## 2020-05-16 NOTE — Patient Instructions (Signed)
Gout  Gout is painful swelling of your joints. Gout is a type of arthritis. It is caused by having too much uric acid in your body. Uric acid is a chemical that is made when your body breaks down substances called purines. If your body has too much uric acid, sharp crystals can form and build up in your joints. This causes pain and swelling. Gout attacks can happen quickly and be very painful (acute gout). Over time, the attacks can affect more joints and happen more often (chronic gout). What are the causes?  Too much uric acid in your blood. This can happen because: ? Your kidneys do not remove enough uric acid from your blood. ? Your body makes too much uric acid. ? You eat too many foods that are high in purines. These foods include organ meats, some seafood, and beer.  Trauma or stress. What increases the risk?  Having a family history of gout.  Being male and middle-aged.  Being male and having gone through menopause.  Being very overweight (obese).  Drinking alcohol, especially beer.  Not having enough water in the body (being dehydrated).  Losing weight too quickly.  Having an organ transplant.  Having lead poisoning.  Taking certain medicines.  Having kidney disease.  Having a skin condition called psoriasis. What are the signs or symptoms? An attack of acute gout usually happens in just one joint. The most common place is the big toe. Attacks often start at night. Other joints that may be affected include joints of the feet, ankle, knee, fingers, wrist, or elbow. Symptoms of an attack may include:  Very bad pain.  Warmth.  Swelling.  Stiffness.  Shiny, red, or purple skin.  Tenderness. The affected joint may be very painful to touch.  Chills and fever. Chronic gout may cause symptoms more often. More joints may be involved. You may also have white or yellow lumps (tophi) on your hands or feet or in other areas near your joints. How is this  treated?  Treatment for this condition has two phases: treating an acute attack and preventing future attacks.  Acute gout treatment may include: ? NSAIDs. ? Steroids. These are taken by mouth or injected into a joint. ? Colchicine. This medicine relieves pain and swelling. It can be given by mouth or through an IV tube.  Preventive treatment may include: ? Taking small doses of NSAIDs or colchicine daily. ? Using a medicine that reduces uric acid levels in your blood. ? Making changes to your diet. You may need to see a food expert (dietitian) about what to eat and drink to prevent gout. Follow these instructions at home: During a gout attack   If told, put ice on the painful area: ? Put ice in a plastic bag. ? Place a towel between your skin and the bag. ? Leave the ice on for 20 minutes, 2-3 times a day.  Raise (elevate) the painful joint above the level of your heart as often as you can.  Rest the joint as much as possible. If the joint is in your leg, you may be given crutches.  Follow instructions from your doctor about what you cannot eat or drink. Avoiding future gout attacks  Eat a low-purine diet. Avoid foods and drinks such as: ? Liver. ? Kidney. ? Anchovies. ? Asparagus. ? Herring. ? Mushrooms. ? Mussels. ? Beer.  Stay at a healthy weight. If you want to lose weight, talk with your doctor. Do not lose weight   too fast.  Start or continue an exercise plan as told by your doctor. Eating and drinking  Drink enough fluids to keep your pee (urine) pale yellow.  If you drink alcohol: ? Limit how much you use to:  0-1 drink a day for women.  0-2 drinks a day for men. ? Be aware of how much alcohol is in your drink. In the [place] one drink equals one 12 oz bottle of beer (355 mL), one 5 oz glass of wine (148 mL), or one 1 oz glass of hard liquor (44 mL). General instructions  Take over-the-counter and prescription medicines only as told by your doctor.  Do  not drive or use heavy machinery while taking prescription pain medicine.  Return to your normal activities as told by your doctor. Ask your doctor what activities are safe for you.  Keep all follow-up visits as told by your doctor. This is important. Contact a doctor if:  You have another gout attack.  You still have symptoms of a gout attack after 10 days of treatment.  You have problems (side effects) because of your medicines.  You have chills or a fever.  You have burning pain when you pee (urinate).  You have pain in your lower back or belly. Get help right away if:  You have very bad pain.  Your pain cannot be controlled.  You cannot pee. Summary  Gout is painful swelling of the joints.  The most common site of pain is the big toe, but it can affect other joints.  Medicines and avoiding some foods can help to prevent and treat gout attacks. This information is not intended to replace advice given to you by your health care provider. Make sure you discuss any questions you have with your health care provider. Document Revised: 06/21/2018 Document Reviewed: 06/21/2018 Elsevier Patient Education  [place]. Colchicine tablets or capsules What is this medicine? COLCHICINE (KOL chi seen) is used to prevent or treat attacks of acute gout or gouty arthritis. This medicine is also used to treat familial Mediterranean fever. This medicine may be used for other purposes; ask your health care provider or pharmacist if you have questions. COMMON BRAND NAME(S): Colcrys, MITIGARE What should I tell my health care provider before I take this medicine? They need to know if you have any of these conditions:  kidney disease  liver disease  an unusual or allergic reaction to colchicine, other medicines, foods, dyes, or preservatives  pregnant or trying to get pregnant  breast-feeding How should I use this medicine? Take this medicine by mouth with a full glass of  water. Follow the directions on the prescription label. You can take it with or without food. If it upsets your stomach, take it with food. Take your medicine at regular intervals. Do not take your medicine more often than directed. A special MedGuide will be given to you by the pharmacist with each prescription and refill. Be sure to read this information carefully each time. Talk to your pediatrician regarding the use of this medicine in children. While this drug may be prescribed for children as young as 41 years old for selected conditions, precautions do apply. Patients over 11 years old may have a stronger reaction and need a smaller dose. Overdosage: If you think you have taken too much of this medicine contact a poison control center or emergency room at once. NOTE: This medicine is only for you. Do not share this medicine with others. What if  I miss a dose? If you miss a dose, take it as soon as you can. If it is almost time for your next dose, take only that dose. Do not take double or extra doses. What may interact with this medicine? Do not take this medicine with any of the following medications:  certain antivirals for HIV or hepatitis This medicine may also interact with the following medications:  certain antibiotics like erythromycin or clarithromycin  certain medicines for blood pressure, heart disease, irregular heart beat  certain medicines for cholesterol like atorvastatin, lovastatin, and simvastatin  certain medicines for fungal infections like ketoconazole, itraconazole, or posaconazole  cyclosporine  grapefruit or grapefruit juice This list may not describe all possible interactions. Give your health care provider a list of all the medicines, herbs, non-prescription drugs, or dietary supplements you use. Also tell them if you smoke, drink alcohol, or use illegal drugs. Some items may interact with your medicine. What should I watch for while using this  medicine? Visit your healthcare professional for regular checks on your progress. Tell your healthcare professional if your symptoms do not start to get better or if they get worse. You should make sure you get enough vitamin B12 while you are taking this medicine. Discuss the foods you eat and the vitamins you take with your healthcare professional. This medicine may increase your risk to bruise or bleed. Call you healthcare professional if you notice any unusual bleeding. What side effects may I notice from receiving this medicine? Side effects that you should report to your doctor or health care professional as soon as possible:  allergic reactions like skin rash, itching or hives, swelling of the face, lips, or tongue  low blood counts - this medicine may decrease the number of white blood cells, red blood cells, and platelets. You may be at increased risk for infections and bleeding  pain, tingling, numbness in the hands or feet  severe diarrhea  signs and symptoms of infection like fever; chills; cough; sore throat; pain or trouble passing urine  signs and symptoms of muscle injury like dark urine; trouble passing urine or change in the amount of urine; unusually weak or tired; muscle pain; back pain  unusual bleeding or bruising  unusually weak or tired  vomiting Side effects that usually do not require medical attention (report these to your doctor or health care professional if they continue or are bothersome):  mild diarrhea  nausea  stomach pain This list may not describe all possible side effects. Call your doctor for medical advice about side effects. You may report side effects to FDA at 1-800-FDA-1088. Where should I keep my medicine? Keep out of the reach of children. Store at room temperature between 15 and 30 degrees C (59 and 86 degrees F). Keep container tightly closed. Protect from light. Throw away any unused medicine after the expiration date. NOTE: This  sheet is a summary. It may not cover all possible information. If you have questions about this medicine, talk to your doctor, pharmacist, or health care provider.  2020 Elsevier/Gold Standard (2018-02-15 18:45:11)
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
## 2020-05-25 ENCOUNTER — Other Ambulatory Visit: Payer: Self-pay | Admitting: Physician Assistant

## 2020-05-25 DIAGNOSIS — M10372 Gout due to renal impairment, left ankle and foot: Secondary | ICD-10-CM

## 2020-10-21 ENCOUNTER — Other Ambulatory Visit: Payer: Self-pay

## 2020-10-21 ENCOUNTER — Inpatient Hospital Stay
Admission: EM | Admit: 2020-10-21 | Discharge: 2020-11-12 | DRG: 177 | Disposition: E | Payer: Managed Care, Other (non HMO) | Attending: Hospitalist | Admitting: Hospitalist

## 2020-10-21 ENCOUNTER — Emergency Department: Payer: Managed Care, Other (non HMO)

## 2020-10-21 ENCOUNTER — Encounter: Payer: Self-pay | Admitting: Internal Medicine

## 2020-10-21 DIAGNOSIS — R0902 Hypoxemia: Secondary | ICD-10-CM

## 2020-10-21 DIAGNOSIS — E43 Unspecified severe protein-calorie malnutrition: Secondary | ICD-10-CM | POA: Diagnosis not present

## 2020-10-21 DIAGNOSIS — M109 Gout, unspecified: Secondary | ICD-10-CM | POA: Diagnosis present

## 2020-10-21 DIAGNOSIS — I120 Hypertensive chronic kidney disease with stage 5 chronic kidney disease or end stage renal disease: Secondary | ICD-10-CM | POA: Diagnosis present

## 2020-10-21 DIAGNOSIS — Z7982 Long term (current) use of aspirin: Secondary | ICD-10-CM

## 2020-10-21 DIAGNOSIS — J441 Chronic obstructive pulmonary disease with (acute) exacerbation: Secondary | ICD-10-CM

## 2020-10-21 DIAGNOSIS — A0839 Other viral enteritis: Secondary | ICD-10-CM | POA: Diagnosis present

## 2020-10-21 DIAGNOSIS — T380X5A Adverse effect of glucocorticoids and synthetic analogues, initial encounter: Secondary | ICD-10-CM | POA: Diagnosis not present

## 2020-10-21 DIAGNOSIS — I2510 Atherosclerotic heart disease of native coronary artery without angina pectoris: Secondary | ICD-10-CM | POA: Diagnosis present

## 2020-10-21 DIAGNOSIS — R0602 Shortness of breath: Secondary | ICD-10-CM

## 2020-10-21 DIAGNOSIS — J9601 Acute respiratory failure with hypoxia: Secondary | ICD-10-CM | POA: Diagnosis present

## 2020-10-21 DIAGNOSIS — Z6827 Body mass index (BMI) 27.0-27.9, adult: Secondary | ICD-10-CM

## 2020-10-21 DIAGNOSIS — R29810 Facial weakness: Secondary | ICD-10-CM | POA: Diagnosis not present

## 2020-10-21 DIAGNOSIS — K921 Melena: Secondary | ICD-10-CM | POA: Diagnosis not present

## 2020-10-21 DIAGNOSIS — R627 Adult failure to thrive: Secondary | ICD-10-CM | POA: Diagnosis present

## 2020-10-21 DIAGNOSIS — U071 COVID-19: Principal | ICD-10-CM

## 2020-10-21 DIAGNOSIS — Z955 Presence of coronary angioplasty implant and graft: Secondary | ICD-10-CM

## 2020-10-21 DIAGNOSIS — I629 Nontraumatic intracranial hemorrhage, unspecified: Secondary | ICD-10-CM | POA: Diagnosis not present

## 2020-10-21 DIAGNOSIS — J159 Unspecified bacterial pneumonia: Secondary | ICD-10-CM | POA: Diagnosis present

## 2020-10-21 DIAGNOSIS — E875 Hyperkalemia: Secondary | ICD-10-CM | POA: Diagnosis not present

## 2020-10-21 DIAGNOSIS — N186 End stage renal disease: Secondary | ICD-10-CM | POA: Diagnosis present

## 2020-10-21 DIAGNOSIS — F17200 Nicotine dependence, unspecified, uncomplicated: Secondary | ICD-10-CM | POA: Diagnosis present

## 2020-10-21 DIAGNOSIS — D631 Anemia in chronic kidney disease: Secondary | ICD-10-CM | POA: Diagnosis present

## 2020-10-21 DIAGNOSIS — Z992 Dependence on renal dialysis: Secondary | ICD-10-CM

## 2020-10-21 DIAGNOSIS — N2581 Secondary hyperparathyroidism of renal origin: Secondary | ICD-10-CM | POA: Diagnosis present

## 2020-10-21 DIAGNOSIS — Z888 Allergy status to other drugs, medicaments and biological substances status: Secondary | ICD-10-CM

## 2020-10-21 DIAGNOSIS — Z515 Encounter for palliative care: Secondary | ICD-10-CM

## 2020-10-21 DIAGNOSIS — I10 Essential (primary) hypertension: Secondary | ICD-10-CM | POA: Diagnosis present

## 2020-10-21 DIAGNOSIS — G936 Cerebral edema: Secondary | ICD-10-CM | POA: Diagnosis not present

## 2020-10-21 DIAGNOSIS — Z66 Do not resuscitate: Secondary | ICD-10-CM | POA: Diagnosis not present

## 2020-10-21 DIAGNOSIS — K219 Gastro-esophageal reflux disease without esophagitis: Secondary | ICD-10-CM | POA: Diagnosis present

## 2020-10-21 DIAGNOSIS — Z79899 Other long term (current) drug therapy: Secondary | ICD-10-CM

## 2020-10-21 DIAGNOSIS — Z7189 Other specified counseling: Secondary | ICD-10-CM

## 2020-10-21 DIAGNOSIS — N051 Unspecified nephritic syndrome with focal and segmental glomerular lesions: Secondary | ICD-10-CM | POA: Diagnosis present

## 2020-10-21 DIAGNOSIS — G9341 Metabolic encephalopathy: Secondary | ICD-10-CM | POA: Diagnosis not present

## 2020-10-21 DIAGNOSIS — I252 Old myocardial infarction: Secondary | ICD-10-CM

## 2020-10-21 DIAGNOSIS — J449 Chronic obstructive pulmonary disease, unspecified: Secondary | ICD-10-CM | POA: Diagnosis present

## 2020-10-21 DIAGNOSIS — R4189 Other symptoms and signs involving cognitive functions and awareness: Secondary | ICD-10-CM | POA: Diagnosis not present

## 2020-10-21 DIAGNOSIS — E1122 Type 2 diabetes mellitus with diabetic chronic kidney disease: Secondary | ICD-10-CM | POA: Diagnosis present

## 2020-10-21 DIAGNOSIS — E1165 Type 2 diabetes mellitus with hyperglycemia: Secondary | ICD-10-CM | POA: Diagnosis not present

## 2020-10-21 DIAGNOSIS — F17210 Nicotine dependence, cigarettes, uncomplicated: Secondary | ICD-10-CM | POA: Diagnosis present

## 2020-10-21 DIAGNOSIS — E782 Mixed hyperlipidemia: Secondary | ICD-10-CM | POA: Diagnosis present

## 2020-10-21 DIAGNOSIS — N179 Acute kidney failure, unspecified: Secondary | ICD-10-CM

## 2020-10-21 DIAGNOSIS — J069 Acute upper respiratory infection, unspecified: Secondary | ICD-10-CM

## 2020-10-21 DIAGNOSIS — J1282 Pneumonia due to coronavirus disease 2019: Secondary | ICD-10-CM | POA: Diagnosis present

## 2020-10-21 DIAGNOSIS — G4700 Insomnia, unspecified: Secondary | ICD-10-CM | POA: Diagnosis present

## 2020-10-21 DIAGNOSIS — B1920 Unspecified viral hepatitis C without hepatic coma: Secondary | ICD-10-CM | POA: Diagnosis present

## 2020-10-21 DIAGNOSIS — J440 Chronic obstructive pulmonary disease with acute lower respiratory infection: Secondary | ICD-10-CM | POA: Diagnosis present

## 2020-10-21 LAB — CBC WITH DIFFERENTIAL/PLATELET
Abs Immature Granulocytes: 0.03 10*3/uL (ref 0.00–0.07)
Basophils Absolute: 0 10*3/uL (ref 0.0–0.1)
Basophils Relative: 0 %
Eosinophils Absolute: 0 10*3/uL (ref 0.0–0.5)
Eosinophils Relative: 1 %
HCT: 40.6 % (ref 39.0–52.0)
Hemoglobin: 13.9 g/dL (ref 13.0–17.0)
Immature Granulocytes: 1 %
Lymphocytes Relative: 15 %
Lymphs Abs: 0.9 10*3/uL (ref 0.7–4.0)
MCH: 32.1 pg (ref 26.0–34.0)
MCHC: 34.2 g/dL (ref 30.0–36.0)
MCV: 93.8 fL (ref 80.0–100.0)
Monocytes Absolute: 0.5 10*3/uL (ref 0.1–1.0)
Monocytes Relative: 8 %
Neutro Abs: 4.4 10*3/uL (ref 1.7–7.7)
Neutrophils Relative %: 75 %
Platelets: 107 10*3/uL — ABNORMAL LOW (ref 150–400)
RBC: 4.33 MIL/uL (ref 4.22–5.81)
RDW: 12.7 % (ref 11.5–15.5)
Smear Review: DECREASED
WBC: 5.9 10*3/uL (ref 4.0–10.5)
nRBC: 0 % (ref 0.0–0.2)

## 2020-10-21 LAB — BASIC METABOLIC PANEL
Anion gap: 18 — ABNORMAL HIGH (ref 5–15)
BUN: 96 mg/dL — ABNORMAL HIGH (ref 8–23)
CO2: 23 mmol/L (ref 22–32)
Calcium: 6.8 mg/dL — ABNORMAL LOW (ref 8.9–10.3)
Chloride: 95 mmol/L — ABNORMAL LOW (ref 98–111)
Creatinine, Ser: 10.53 mg/dL — ABNORMAL HIGH (ref 0.61–1.24)
GFR, Estimated: 5 mL/min — ABNORMAL LOW (ref >60)
Glucose, Bld: 126 mg/dL — ABNORMAL HIGH (ref 70–99)
Potassium: 4.8 mmol/L (ref 3.5–5.1)
Sodium: 136 mmol/L (ref 135–145)

## 2020-10-21 LAB — RESPIRATORY PANEL BY RT PCR (FLU A&B, COVID)
Influenza A by PCR: NEGATIVE
Influenza B by PCR: NEGATIVE
SARS Coronavirus 2 by RT PCR: POSITIVE — AB

## 2020-10-21 LAB — TROPONIN I (HIGH SENSITIVITY)
Troponin I (High Sensitivity): 120 ng/L (ref <18)
Troponin I (High Sensitivity): 105 ng/L (ref <18)

## 2020-10-21 LAB — BRAIN NATRIURETIC PEPTIDE: B Natriuretic Peptide: 28.7 pg/mL (ref 0.0–100.0)

## 2020-10-21 MED ORDER — ISOSORBIDE MONONITRATE ER 30 MG PO TB24
30.0000 mg | ORAL_TABLET | Freq: Every day | ORAL | Status: DC
  Administered 2020-10-21 – 2020-10-22 (×2): 30 mg via ORAL
  Filled 2020-10-21 (×2): qty 1

## 2020-10-21 MED ORDER — CARVEDILOL 25 MG PO TABS
25.0000 mg | ORAL_TABLET | Freq: Two times a day (BID) | ORAL | Status: DC
  Administered 2020-10-22 – 2020-10-30 (×15): 25 mg via ORAL
  Filled 2020-10-21 (×19): qty 1

## 2020-10-21 MED ORDER — ALBUTEROL SULFATE (2.5 MG/3ML) 0.083% IN NEBU
10.0000 mg | INHALATION_SOLUTION | Freq: Once | RESPIRATORY_TRACT | Status: AC
  Administered 2020-10-21: 10 mg via RESPIRATORY_TRACT
  Filled 2020-10-21: qty 12

## 2020-10-21 MED ORDER — DEXAMETHASONE SODIUM PHOSPHATE 10 MG/ML IJ SOLN
6.0000 mg | INTRAMUSCULAR | Status: DC
  Administered 2020-10-22 – 2020-10-23 (×2): 6 mg via INTRAVENOUS
  Filled 2020-10-21 (×2): qty 1

## 2020-10-21 MED ORDER — CALCITRIOL 0.25 MCG PO CAPS
0.2500 ug | ORAL_CAPSULE | Freq: Every day | ORAL | Status: DC
  Administered 2020-10-22 – 2020-10-30 (×7): 0.25 ug via ORAL
  Filled 2020-10-21 (×10): qty 1

## 2020-10-21 MED ORDER — IPRATROPIUM BROMIDE 0.02 % IN SOLN
0.5000 mg | Freq: Once | RESPIRATORY_TRACT | Status: AC
  Administered 2020-10-21: 0.5 mg via RESPIRATORY_TRACT
  Filled 2020-10-21: qty 2.5

## 2020-10-21 MED ORDER — IPRATROPIUM-ALBUTEROL 0.5-2.5 (3) MG/3ML IN SOLN: 3.0000 mL | Freq: Two times a day (BID) | RESPIRATORY_TRACT | Status: DC

## 2020-10-21 MED ORDER — ALBUTEROL SULFATE HFA 108 (90 BASE) MCG/ACT IN AERS
2.0000 | INHALATION_SPRAY | RESPIRATORY_TRACT | Status: DC | PRN
  Filled 2020-10-21: qty 6.7

## 2020-10-21 MED ORDER — LACTATED RINGERS IV SOLN: INTRAVENOUS | Status: DC

## 2020-10-21 MED ORDER — ACETAMINOPHEN 325 MG PO TABS: 650.0000 mg | ORAL_TABLET | Freq: Four times a day (QID) | ORAL | Status: DC | PRN

## 2020-10-21 MED ORDER — LACTATED RINGERS IV BOLUS
500.0000 mL | Freq: Once | INTRAVENOUS | Status: AC
  Administered 2020-10-21: 500 mL via INTRAVENOUS

## 2020-10-21 MED ORDER — AMLODIPINE BESYLATE 5 MG PO TABS: 10.0000 mg | ORAL_TABLET | Freq: Every day | ORAL | Status: DC

## 2020-10-21 MED ORDER — METHYLPREDNISOLONE SODIUM SUCC 125 MG IJ SOLR
125.0000 mg | Freq: Once | INTRAMUSCULAR | Status: AC
  Administered 2020-10-21: 125 mg via INTRAVENOUS
  Filled 2020-10-21: qty 2

## 2020-10-21 MED ORDER — DEXAMETHASONE 4 MG PO TABS: 6.0000 mg | ORAL_TABLET | ORAL | Status: DC

## 2020-10-21 MED ORDER — ACETAMINOPHEN 500 MG PO TABS
1000.0000 mg | ORAL_TABLET | Freq: Once | ORAL | Status: AC
  Administered 2020-10-21: 1000 mg via ORAL
  Filled 2020-10-21: qty 2

## 2020-10-21 MED ORDER — CALCIUM CARBONATE 1250 (500 CA) MG PO TABS
1.0000 | ORAL_TABLET | Freq: Two times a day (BID) | ORAL | Status: DC
  Administered 2020-10-21 – 2020-10-30 (×14): 500 mg via ORAL
  Filled 2020-10-21 (×23): qty 1

## 2020-10-21 MED ORDER — HEPARIN SODIUM (PORCINE) 5000 UNIT/ML IJ SOLN
5000.0000 [IU] | Freq: Three times a day (TID) | INTRAMUSCULAR | Status: DC
  Administered 2020-10-21 – 2020-10-31 (×29): 5000 [IU] via SUBCUTANEOUS
  Filled 2020-10-21 (×30): qty 1

## 2020-10-21 MED ORDER — TORSEMIDE 100 MG PO TABS
100.0000 mg | ORAL_TABLET | Freq: Every day | ORAL | Status: DC
  Administered 2020-10-21 – 2020-10-30 (×8): 100 mg via ORAL
  Filled 2020-10-21 (×11): qty 1

## 2020-10-21 MED ORDER — ASPIRIN 81 MG PO CHEW
81.0000 mg | CHEWABLE_TABLET | Freq: Every day | ORAL | Status: DC
  Administered 2020-10-21 – 2020-10-30 (×8): 81 mg via ORAL
  Filled 2020-10-21 (×9): qty 1

## 2020-10-21 MED ORDER — LACTATED RINGERS IV BOLUS: 1000.0000 mL | Freq: Once | INTRAVENOUS | Status: DC

## 2020-10-21 MED ORDER — ATORVASTATIN CALCIUM 80 MG PO TABS
80.0000 mg | ORAL_TABLET | Freq: Every day | ORAL | Status: DC
  Administered 2020-10-21 – 2020-10-30 (×8): 80 mg via ORAL
  Filled 2020-10-21 (×3): qty 1
  Filled 2020-10-21 (×3): qty 4
  Filled 2020-10-21: qty 1
  Filled 2020-10-21 (×2): qty 4

## 2020-10-21 MED ORDER — HYDRALAZINE HCL 50 MG PO TABS: 100.0000 mg | ORAL_TABLET | Freq: Three times a day (TID) | ORAL | Status: DC

## 2020-10-21 NOTE — Progress Notes (Signed)
[facility]  ROUNDING NOTE   Subjective:  Willie Hahn is known to our practice, has h/o COPD,CAD and ESRD on peritoneal dialysis.He came to ED with SOB, diagnosed with COVID 19 infection.   Objective:  Vital signs in last 24 hours:  Temp:  [99.5 F (37.5 C)] 99.5 F (37.5 C) (11/09 1212) Pulse Rate:  [69-87] 69 (11/09 1600) Resp:  [14-32] 17 (11/09 1600) BP: (104-127)/(54-71) 124/71 (11/09 1600) SpO2:  [83 %-97 %] 94 % (11/09 1600) Weight:  [81.6 kg] 81.6 kg (11/09 1213)  Weight change:  Filed Weights   10/27/2020 1213  Weight: 81.6 kg    Intake/Output: No intake/output data recorded.   Intake/Output this shift:  No intake/output data recorded.  Physical Exam: General: Resting in bed, ill appearing  Head: Normocephalic, atraumatic. Moist oral mucosal membranes  Eyes: Anicteric  Lungs:   Dyspnea on mild exertion,On 6LO2 via [place], Lungs with rhonchi and wheezes bilaterally  Heart: Regular rate and rhythm,HR in 70's  Abdomen:  Soft, nontender, non distended  Extremities:  No  peripheral edema.  Neurologic: Nonfocal, moving all four extremities  Skin: No acute lesions or rashes  Access: Peritoneal  catheter    Basic Metabolic Panel: Recent Labs  Lab 11/08/2020 1213  NA 136  K 4.8  CL 95*  CO2 23  GLUCOSE 126*  BUN 96*  CREATININE 10.53*  CALCIUM 6.8*    Liver Function Tests: No results for input(s): AST, ALT, ALKPHOS, BILITOT, PROT, ALBUMIN in the last 168 hours. No results for input(s): LIPASE, AMYLASE in the last 168 hours. No results for input(s): AMMONIA in the last 168 hours.  CBC: Recent Labs  Lab 10/29/2020 1213  WBC 5.9  NEUTROABS 4.4  HGB 13.9  HCT 40.6  MCV 93.8  PLT 107*    Cardiac Enzymes: No results for input(s): CKTOTAL, CKMB, CKMBINDEX, TROPONINI in the last 168 hours.  BNP: Invalid input(s): POCBNP  CBG: No results for input(s): GLUCAP in the last 168 hours.  Microbiology: Results for orders placed or performed during  the hospital encounter of 10/28/2020  Respiratory Panel by RT PCR (Flu A&B, Covid) - Nasopharyngeal Swab     Status: Abnormal   Collection Time: 10/19/2020  1:16 PM   Specimen: Nasopharyngeal Swab  Result Value Ref Range Status   SARS Coronavirus 2 by RT PCR POSITIVE (A) NEGATIVE Final    Comment: RESULT CALLED TO, READ BACK BY AND VERIFIED WITH: REID RENO 10/16/2020 1433 KLW (NOTE) SARS-CoV-2 target nucleic acids are DETECTED.  SARS-CoV-2 RNA is generally detectable in upper respiratory specimens  during the acute phase of infection. Positive results are indicative of the presence of the identified virus, but do not rule out bacterial infection or co-infection with other pathogens not detected by the test. Clinical correlation with patient history and other diagnostic information is necessary to determine patient infection status. The expected result is Negative.  Fact Sheet for Patients:  PinkCheek.be  Fact Sheet for Healthcare Providers: GravelBags.it  This test is not yet approved or cleared by the [place] FDA and  has been authorized for detection and/or diagnosis of SARS-CoV-2 by FDA under an Emergency Use Authorization (EUA).  This EUA will remain in effect (meaning this test can be used) fo r the duration of  the COVID-19 declaration under Section 564(b)(1) of the Act, 21 U.S.C. section 360bbb-3(b)(1), unless the authorization is terminated or revoked sooner.      Influenza A by PCR NEGATIVE NEGATIVE Final   Influenza B by  PCR NEGATIVE NEGATIVE Final    Comment: (NOTE) The Xpert Xpress SARS-CoV-2/FLU/RSV assay is intended as an aid in  the diagnosis of influenza from Nasopharyngeal swab specimens and  should not be used as a sole basis for treatment. Nasal washings and  aspirates are unacceptable for Xpert Xpress SARS-CoV-2/FLU/RSV  testing.  Fact Sheet for  Patients: PinkCheek.be  Fact Sheet for Healthcare Providers: GravelBags.it  This test is not yet approved or cleared by the [place] FDA and  has been authorized for detection and/or diagnosis of SARS-CoV-2 by  FDA under an Emergency Use Authorization (EUA). This EUA will remain  in effect (meaning this test can be used) for the duration of the  Covid-19 declaration under Section 564(b)(1) of the Act, 21  U.S.C. section 360bbb-3(b)(1), unless the authorization is  terminated or revoked. Performed at [facility], [address] [place], [place] [address]     Coagulation Studies: No results for input(s): LABPROT, INR in the last 72 hours.  Urinalysis: No results for input(s): COLORURINE, LABSPEC, PHURINE, GLUCOSEU, HGBUR, BILIRUBINUR, KETONESUR, PROTEINUR, UROBILINOGEN, NITRITE, LEUKOCYTESUR in the last 72 hours.  Invalid input(s): APPERANCEUR    Imaging: DG Chest Portable 1 View  Result Date: 10/23/2020 CLINICAL DATA:  Shortness of breath, hypoxia, history COPD, evaluate infiltrate EXAM: PORTABLE CHEST 1 VIEW COMPARISON:  Portable exam 0102 hours compared to 03/01/2011 FINDINGS: Normal heart size, mediastinal contours, and pulmonary vascularity. Atherosclerotic calcification aorta. Bibasilar infiltrates consistent with multifocal pneumonia. No pleural effusion or pneumothorax. Osseous structures unremarkable. IMPRESSION: Bibasilar pulmonary infiltrates consistent with multifocal pneumonia. Electronically Signed   By: Lavonia Dana M.D.   On: 11/08/2020 13:10     Medications:   . lactated ringers 125 mL/hr at 11/01/2020 1545   . aspirin  81 mg Oral Daily  . atorvastatin  80 mg Oral Daily  . [START ON 10/22/2020] calcitRIOL  0.25 mcg Oral Daily  . carvedilol  25 mg Oral BID  . [START ON 10/22/2020] dexamethasone  6 mg Oral Q24H  . heparin  5,000 Units Subcutaneous Q8H  . isosorbide mononitrate  30 mg Oral  Daily  . torsemide  100 mg Oral Daily     Assessment/ Plan:  Willie Hahn is a 64 y.o.  male  has h/o COPD,CAD and ESRD on peritoneal dialysis.He came to ED with SOB, diagnosed with COVID 19 infection.   # ESRD on peritoneal dialysis  Patient reports his last peritoneal dialysis session was last night We will hold dialysis tonight Plan PD  tomorrow  IV Fluid rate reduced to 50 ml/hr to avoid fluid overload   #Secondary hyperparathyroidism Calcium 6.8  On calcitriol Will continue monitoring bone mineral metabolism parameters  #Hypertension Normotensive now Patient is on Torsemide,Imdur and Carvedilol  # Covid 19 infection  Patient is unvaccinated Covid 19 +  in ED  Chest Xray  IMPRESSION: Bibasilar pulmonary infiltrates consistent with multifocal pneumonia SpO2 95% on 6L O2 via nasal cannula Management per primary team  LOS: 0 Mackey Varricchio 11/9/20215:05 PM

## 2020-10-21 NOTE — ED Notes (Signed)
Pt O2 on RA 83%. Pt placed on 3L O2 via [place]. Step[hanie, RN made aware.

## 2020-10-21 NOTE — ED Notes (Signed)
Rainbow sent to the lab.  

## 2020-10-21 NOTE — ED Notes (Signed)
O2 increased to 6lpm
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
## 2020-10-21 NOTE — ED Triage Notes (Signed)
Pt reports SOB beginning yesterday

## 2020-10-22 ENCOUNTER — Other Ambulatory Visit: Payer: Self-pay

## 2020-10-22 DIAGNOSIS — J9601 Acute respiratory failure with hypoxia: Secondary | ICD-10-CM | POA: Diagnosis present

## 2020-10-22 DIAGNOSIS — I120 Hypertensive chronic kidney disease with stage 5 chronic kidney disease or end stage renal disease: Secondary | ICD-10-CM | POA: Diagnosis present

## 2020-10-22 DIAGNOSIS — R627 Adult failure to thrive: Secondary | ICD-10-CM | POA: Diagnosis present

## 2020-10-22 DIAGNOSIS — N186 End stage renal disease: Secondary | ICD-10-CM | POA: Diagnosis present

## 2020-10-22 DIAGNOSIS — Z7189 Other specified counseling: Secondary | ICD-10-CM | POA: Diagnosis not present

## 2020-10-22 DIAGNOSIS — K921 Melena: Secondary | ICD-10-CM | POA: Diagnosis not present

## 2020-10-22 DIAGNOSIS — K219 Gastro-esophageal reflux disease without esophagitis: Secondary | ICD-10-CM | POA: Diagnosis present

## 2020-10-22 DIAGNOSIS — G936 Cerebral edema: Secondary | ICD-10-CM | POA: Diagnosis not present

## 2020-10-22 DIAGNOSIS — D631 Anemia in chronic kidney disease: Secondary | ICD-10-CM | POA: Diagnosis present

## 2020-10-22 DIAGNOSIS — R29810 Facial weakness: Secondary | ICD-10-CM | POA: Diagnosis not present

## 2020-10-22 DIAGNOSIS — J1282 Pneumonia due to coronavirus disease 2019: Secondary | ICD-10-CM | POA: Diagnosis present

## 2020-10-22 DIAGNOSIS — J441 Chronic obstructive pulmonary disease with (acute) exacerbation: Secondary | ICD-10-CM | POA: Diagnosis present

## 2020-10-22 DIAGNOSIS — N051 Unspecified nephritic syndrome with focal and segmental glomerular lesions: Secondary | ICD-10-CM | POA: Diagnosis not present

## 2020-10-22 DIAGNOSIS — U071 COVID-19: Secondary | ICD-10-CM | POA: Diagnosis present

## 2020-10-22 DIAGNOSIS — I629 Nontraumatic intracranial hemorrhage, unspecified: Secondary | ICD-10-CM | POA: Diagnosis not present

## 2020-10-22 DIAGNOSIS — J159 Unspecified bacterial pneumonia: Secondary | ICD-10-CM | POA: Diagnosis present

## 2020-10-22 DIAGNOSIS — M109 Gout, unspecified: Secondary | ICD-10-CM | POA: Diagnosis present

## 2020-10-22 DIAGNOSIS — I10 Essential (primary) hypertension: Secondary | ICD-10-CM | POA: Diagnosis not present

## 2020-10-22 DIAGNOSIS — J440 Chronic obstructive pulmonary disease with acute lower respiratory infection: Secondary | ICD-10-CM | POA: Diagnosis present

## 2020-10-22 DIAGNOSIS — G9341 Metabolic encephalopathy: Secondary | ICD-10-CM | POA: Diagnosis not present

## 2020-10-22 DIAGNOSIS — N2581 Secondary hyperparathyroidism of renal origin: Secondary | ICD-10-CM | POA: Diagnosis present

## 2020-10-22 DIAGNOSIS — Z66 Do not resuscitate: Secondary | ICD-10-CM | POA: Diagnosis not present

## 2020-10-22 DIAGNOSIS — Z515 Encounter for palliative care: Secondary | ICD-10-CM | POA: Diagnosis not present

## 2020-10-22 DIAGNOSIS — I2510 Atherosclerotic heart disease of native coronary artery without angina pectoris: Secondary | ICD-10-CM | POA: Diagnosis present

## 2020-10-22 DIAGNOSIS — A0839 Other viral enteritis: Secondary | ICD-10-CM | POA: Diagnosis present

## 2020-10-22 DIAGNOSIS — I611 Nontraumatic intracerebral hemorrhage in hemisphere, cortical: Secondary | ICD-10-CM | POA: Diagnosis not present

## 2020-10-22 DIAGNOSIS — E43 Unspecified severe protein-calorie malnutrition: Secondary | ICD-10-CM | POA: Diagnosis not present

## 2020-10-22 DIAGNOSIS — R0602 Shortness of breath: Secondary | ICD-10-CM | POA: Diagnosis present

## 2020-10-22 DIAGNOSIS — N179 Acute kidney failure, unspecified: Secondary | ICD-10-CM | POA: Diagnosis present

## 2020-10-22 LAB — HEPATIC FUNCTION PANEL
ALT: 21 U/L (ref 0–44)
AST: 52 U/L — ABNORMAL HIGH (ref 15–41)
Albumin: 2.7 g/dL — ABNORMAL LOW (ref 3.5–5.0)
Alkaline Phosphatase: 55 U/L (ref 38–126)
Bilirubin, Direct: 0.1 mg/dL (ref 0.0–0.2)
Indirect Bilirubin: 0.6 mg/dL (ref 0.3–0.9)
Total Bilirubin: 0.7 mg/dL (ref 0.3–1.2)
Total Protein: 5.8 g/dL — ABNORMAL LOW (ref 6.5–8.1)

## 2020-10-22 LAB — FIBRIN DERIVATIVES D-DIMER (ARMC ONLY): Fibrin derivatives D-dimer (ARMC): 2160.04 ng/mL (FEU) — ABNORMAL HIGH (ref 0.00–499.00)

## 2020-10-22 LAB — HIV ANTIBODY (ROUTINE TESTING W REFLEX): HIV Screen 4th Generation wRfx: NONREACTIVE

## 2020-10-22 LAB — C-REACTIVE PROTEIN: CRP: 7.7 mg/dL — ABNORMAL HIGH (ref <1.0)

## 2020-10-22 MED ORDER — DELFLEX-LC/1.5% DEXTROSE 344 MOSM/L IP SOLN
INTRAPERITONEAL | Status: DC
  Administered 2020-10-29: 6000 mL via INTRAPERITONEAL
  Administered 2020-10-30: 3000 mL via INTRAPERITONEAL
  Filled 2020-10-22 (×11): qty 3000

## 2020-10-22 MED ORDER — SODIUM CHLORIDE 0.9 % IV SOLN
200.0000 mg | Freq: Once | INTRAVENOUS | Status: AC
  Administered 2020-10-22: 200 mg via INTRAVENOUS
  Filled 2020-10-22: qty 40

## 2020-10-22 MED ORDER — IPRATROPIUM-ALBUTEROL 20-100 MCG/ACT IN AERS
1.0000 | INHALATION_SPRAY | Freq: Four times a day (QID) | RESPIRATORY_TRACT | Status: DC
  Administered 2020-10-22 – 2020-10-30 (×29): 1 via RESPIRATORY_TRACT
  Filled 2020-10-22: qty 4

## 2020-10-22 MED ORDER — HEPARIN 1000 UNIT/ML FOR PERITONEAL DIALYSIS
500.0000 [IU] | INTRAMUSCULAR | Status: DC | PRN
  Filled 2020-10-22: qty 0.5

## 2020-10-22 MED ORDER — GENTAMICIN SULFATE 0.1 % EX CREA
1.0000 "application " | TOPICAL_CREAM | Freq: Every day | CUTANEOUS | Status: DC
  Administered 2020-10-23 – 2020-10-31 (×8): 1 via TOPICAL
  Filled 2020-10-22 (×2): qty 15

## 2020-10-22 MED ORDER — SODIUM CHLORIDE 0.9 % IV SOLN
100.0000 mg | Freq: Every day | INTRAVENOUS | Status: AC
  Administered 2020-10-23 – 2020-10-26 (×4): 100 mg via INTRAVENOUS
  Filled 2020-10-22 (×4): qty 20

## 2020-10-22 MED ORDER — ORAL CARE MOUTH RINSE
15.0000 mL | Freq: Two times a day (BID) | OROMUCOSAL | Status: DC
  Administered 2020-10-22 – 2020-11-02 (×21): 15 mL via OROMUCOSAL

## 2020-10-22 MED ORDER — IPRATROPIUM-ALBUTEROL 20-100 MCG/ACT IN AERS
1.0000 | INHALATION_SPRAY | Freq: Two times a day (BID) | RESPIRATORY_TRACT | Status: DC
  Administered 2020-10-22: 10:00:00 1 via RESPIRATORY_TRACT
  Filled 2020-10-22: qty 4

## 2020-10-22 NOTE — Progress Notes (Signed)
Remdesivir - Pharmacy Brief Note   O:  ALT:   CXR: Bibasilar pulmonary infiltrates consistent with multifocal pneumonia. SpO2: 93% on 02 6 L/min   A/P:  Remdesivir 200 mg IVPB once followed by 100 mg IVPB daily x 4 days.   Chinita Greenland PharmD Clinical Pharmacist 10/22/2020

## 2020-10-22 NOTE — Progress Notes (Signed)
PROGRESS NOTE    Willie Hahn  ZOX:[id number] DOB: 1955-12-31 DOA: 10/13/2020 PCP: Birdie Sons, MD    Assessment & Plan:   Principal Problem:   Acute hypoxemic respiratory failure due to COVID-19 [facility]) Active Problems:   Coronary artery disease   Essential hypertension   Insomnia   Compulsive tobacco user syndrome   H/O placement of stent in anterior descending branch of left coronary artery   Mixed hyperlipidemia   CKD (chronic kidney disease) stage V requiring chronic dialysis ([place])   Focal segmental glomerulosclerosis   Pneumonia due to COVID-19 virus    Willie Hahn is a 64 y.o. male with medical history significant for hypertension, hyperlipidemia, gout in remission, end-stage renal disease on peritoneal dialysis presented to the emergency department for chief concerns of shortness of breath x6 days worsening with minimal improvement with home inhaler for his COPD.   Acute hypoxic respiratory failure secondary to COVID-19 PNA -Status post Solu-Medrol 125 mg IV per ED provider --O2 requirement up to 6L currently --CRP 7.7 PLAN: --Start Remdesivir today --cont steroid as decadron --cont combivent -Continue with oxygen therapy to maintain SPO2 greater than 92%, incentive spirometry every hour while awake, flutter valve every 2 hours while awake  CAD -appears stable at this time no clinical indication for ACS --on home atorvastatin 80 mg daily, Coreg 25 mg twice daily, Imdur 30 mg daily, aspirin 81 mg daily --cont home regimen except hold Imdur since BP low normal  COPD  -Tiotropium nebs at home is held -cont Combivent as QID -Albuterol PRN q4h for shortness of breath and wheezing  Hypertension  --BP low normal -On home amlodipine 10 mg daily, carvedilol 25 mg bid, hydralazine 50 mg TID, losartan-hctz 100-12.5 daily at home, torsemide --cont home coreg and torsemide --Hold the rest due to low normal BP  ESRD on peritoneal dialysis -Status post 500 cc  LR bolus in the ED -Patient endorses compliance with peritoneal dialysis -Nephrology has been consulted -Patient is still urinating PLAN: --Hold IVF and encourage oral hydration --cont home torsemide 100 mg daily --peritoneal dialysis per nephrology  Hyperlipidemia -cont atorvastatin 80 mg daily  Tobacco user -patient not ready to quit   DVT prophylaxis: Heparin SQ Code Status: Limited code  Family Communication:  Status is: inpatient Dispo:   The patient is from: home Anticipated d/c is to: home Anticipated d/c date is: >3 days Patient currently is not medically stable to d/c due to: COVID PNA, 6L O2.   Subjective and Interval History:  Pt said he was doing ok, but was very subdued and not interactive.     Objective: Vitals:   10/22/20 0454 10/22/20 0804 10/22/20 1100 10/22/20 1700  BP: 120/68 108/63 117/73 113/60  Pulse: 68 72    Resp: 16 19    Temp: 98.3 F (36.8 C) 98.4 F (36.9 C) 98.3 F (36.8 C) 98.5 F (36.9 C)  TempSrc:   Oral Oral  SpO2: 93% 93% 92%   Weight:      Height:       No intake or output data in the 24 hours ending 10/22/20 1727 Filed Weights   11/06/2020 1213 10/31/2020 2105  Weight: 81.6 kg 78 kg    Examination:   Constitutional: NAD, AAOx3 HEENT: conjunctivae and lids normal, EOMI CV: RRR no M,R,G. Distal pulses +2.  No cyanosis.   RESP: CTA B/L over anterior, on 6L GI: +BS, NTND Extremities: compression stockings on BLE SKIN: warm, dry and intact Neuro: II - XII  grossly intact.  Sensation intact    Data Reviewed: I have personally reviewed following labs and imaging studies  CBC: Recent Labs  Lab 10/31/2020 1213  WBC 5.9  NEUTROABS 4.4  HGB 13.9  HCT 40.6  MCV 93.8  PLT 509*   Basic Metabolic Panel: Recent Labs  Lab 10/29/2020 1213  NA 136  K 4.8  CL 95*  CO2 23  GLUCOSE 126*  BUN 96*  CREATININE 10.53*  CALCIUM 6.8*   GFR: Estimated Creatinine Clearance: 7.2 mL/min (A) (by C-G formula based on SCr of 10.53  mg/dL (H)). Liver Function Tests: Recent Labs  Lab 10/22/20 1431  AST 52*  ALT 21  ALKPHOS 55  BILITOT 0.7  PROT 5.8*  ALBUMIN 2.7*   No results for input(s): LIPASE, AMYLASE in the last 168 hours. No results for input(s): AMMONIA in the last 168 hours. Coagulation Profile: No results for input(s): INR, PROTIME in the last 168 hours. Cardiac Enzymes: No results for input(s): CKTOTAL, CKMB, CKMBINDEX, TROPONINI in the last 168 hours. BNP (last 3 results) No results for input(s): PROBNP in the last 8760 hours. HbA1C: No results for input(s): HGBA1C in the last 72 hours. CBG: No results for input(s): GLUCAP in the last 168 hours. Lipid Profile: No results for input(s): CHOL, HDL, LDLCALC, TRIG, CHOLHDL, LDLDIRECT in the last 72 hours. Thyroid Function Tests: No results for input(s): TSH, T4TOTAL, FREET4, T3FREE, THYROIDAB in the last 72 hours. Anemia Panel: No results for input(s): VITAMINB12, FOLATE, FERRITIN, TIBC, IRON, RETICCTPCT in the last 72 hours. Sepsis Labs: No results for input(s): PROCALCITON, LATICACIDVEN in the last 168 hours.  Recent Results (from the past 240 hour(s))  Respiratory Panel by RT PCR (Flu A&B, Covid) - Nasopharyngeal Swab     Status: Abnormal   Collection Time: 11/02/2020  1:16 PM   Specimen: Nasopharyngeal Swab  Result Value Ref Range Status   SARS Coronavirus 2 by RT PCR POSITIVE (A) NEGATIVE Final    Comment: RESULT CALLED TO, READ BACK BY AND VERIFIED WITH: REID RENO 10/27/2020 1433 KLW (NOTE) SARS-CoV-2 target nucleic acids are DETECTED.  SARS-CoV-2 RNA is generally detectable in upper respiratory specimens  during the acute phase of infection. Positive results are indicative of the presence of the identified virus, but do not rule out bacterial infection or co-infection with other pathogens not detected by the test. Clinical correlation with patient history and other diagnostic information is necessary to determine patient infection status.  The expected result is Negative.  Fact Sheet for Patients:  PinkCheek.be  Fact Sheet for Healthcare Providers: GravelBags.it  This test is not yet approved or cleared by the [place] FDA and  has been authorized for detection and/or diagnosis of SARS-CoV-2 by FDA under an Emergency Use Authorization (EUA).  This EUA will remain in effect (meaning this test can be used) fo r the duration of  the COVID-19 declaration under Section 564(b)(1) of the Act, 21 U.S.C. section 360bbb-3(b)(1), unless the authorization is terminated or revoked sooner.      Influenza A by PCR NEGATIVE NEGATIVE Final   Influenza B by PCR NEGATIVE NEGATIVE Final    Comment: (NOTE) The Xpert Xpress SARS-CoV-2/FLU/RSV assay is intended as an aid in  the diagnosis of influenza from Nasopharyngeal swab specimens and  should not be used as a sole basis for treatment. Nasal washings and  aspirates are unacceptable for Xpert Xpress SARS-CoV-2/FLU/RSV  testing.  Fact Sheet for Patients: PinkCheek.be  Fact Sheet for Healthcare Providers: GravelBags.it  This  test is not yet approved or cleared by the [place] and  has been authorized for detection and/or diagnosis of SARS-CoV-2 by  FDA under an Emergency Use Authorization (EUA). This EUA will remain  in effect (meaning this test can be used) for the duration of the  Covid-19 declaration under Section 564(b)(1) of the Act, 21  U.S.C. section 360bbb-3(b)(1), unless the authorization is  terminated or revoked. Performed at [facility], [address], [place], [place] [address]       Radiology Studies: DG Chest Portable 1 View  Result Date: 11/04/2020 CLINICAL DATA:  Shortness of breath, hypoxia, history COPD, evaluate infiltrate EXAM: PORTABLE CHEST 1 VIEW COMPARISON:  Portable exam 3817 hours compared to 03/01/2011  FINDINGS: Normal heart size, mediastinal contours, and pulmonary vascularity. Atherosclerotic calcification aorta. Bibasilar infiltrates consistent with multifocal pneumonia. No pleural effusion or pneumothorax. Osseous structures unremarkable. IMPRESSION: Bibasilar pulmonary infiltrates consistent with multifocal pneumonia. Electronically Signed   By: Lavonia Dana M.D.   On: 10/20/2020 13:10     Scheduled Meds:  aspirin  81 mg Oral Daily   atorvastatin  80 mg Oral Daily   calcitRIOL  0.25 mcg Oral Daily   calcium carbonate  1 tablet Oral BID WC   carvedilol  25 mg Oral BID   dexamethasone (DECADRON) injection  6 mg Intravenous Q24H   gentamicin cream  1 application Topical Daily   heparin  5,000 Units Subcutaneous Q8H   Ipratropium-Albuterol  1 puff Inhalation BID   isosorbide mononitrate  30 mg Oral Daily   mouth rinse  15 mL Mouth Rinse BID   torsemide  100 mg Oral Daily   Continuous Infusions:  dialysis solution 1.5% low-MG/low-CA     [START ON 10/23/2020] remdesivir 100 mg in NS 100 mL       LOS: 0 days     Enzo Bi, MD [facility] If 7PM-7AM, please contact night-coverage 10/22/2020, 5:27 PM

## 2020-10-22 NOTE — Plan of Care (Signed)
  Problem: Education: Goal: Knowledge of risk factors and measures for prevention of condition will improve Outcome: Progressing   Problem: Respiratory: Goal: Will maintain a patent airway Outcome: Progressing   Problem: Respiratory: Goal: Complications related to the disease process, condition or treatment will be avoided or minimized Outcome: Progressing   Problem: Education: Goal: Knowledge of General Education information will improve Description: Including pain rating scale, medication(s)/side effects and non-pharmacologic comfort measures Outcome: Progressing   Problem: Clinical Measurements: Goal: Ability to maintain clinical measurements within normal limits will improve Outcome: Progressing

## 2020-10-22 NOTE — Plan of Care (Signed)
  Problem: Education: Goal: Knowledge of risk factors and measures for prevention of condition will improve Outcome: Progressing   Problem: Coping: Goal: Psychosocial and spiritual needs will be supported Outcome: Progressing   Problem: Respiratory: Goal: Will maintain a patent airway Outcome: Progressing Goal: Complications related to the disease process, condition or treatment will be avoided or minimized Outcome: Progressing   Problem: Education: Goal: Knowledge of General Education information will improve Description: Including pain rating scale, medication(s)/side effects and non-pharmacologic comfort measures Outcome: Progressing   Problem: Clinical Measurements: Goal: Ability to maintain clinical measurements within normal limits will improve Outcome: Progressing Goal: Will remain free from infection Outcome: Progressing Goal: Diagnostic test results will improve Outcome: Progressing Goal: Respiratory complications will improve Outcome: Progressing Goal: Cardiovascular complication will be avoided Outcome: Progressing   Problem: Elimination: Goal: Will not experience complications related to bowel motility Outcome: Progressing Goal: Will not experience complications related to urinary retention Outcome: Progressing   Problem: Pain Managment: Goal: General experience of comfort will improve Outcome: Progressing   Problem: Safety: Goal: Ability to remain free from injury will improve Outcome: Progressing   Problem: Skin Integrity: Goal: Risk for impaired skin integrity will decrease Outcome: Progressing

## 2020-10-22 NOTE — Progress Notes (Signed)
[facility]  ROUNDING NOTE   Subjective:  Patient reports feeling little better today.He is still on 6L O2, normal respiratory effort noted. We will plan for peritoneal dialysis tonight.  Objective:  Vital signs in last 24 hours:  Temp:  [97.4 F (36.3 C)-98.4 F (36.9 C)] 98.3 F (36.8 C) (11/10 1100) Pulse Rate:  [62-80] 72 (11/10 0804) Resp:  [14-30] 19 (11/10 0804) BP: (88-124)/(52-73) 117/73 (11/10 1100) SpO2:  [88 %-98 %] 92 % (11/10 1100) Weight:  [78 kg] 78 kg (11/09 2105)  Weight change:  Filed Weights   10/29/2020 1213 10/25/2020 2105  Weight: 81.6 kg 78 kg    Intake/Output: No intake/output data recorded.   Intake/Output this shift:  No intake/output data recorded.  Physical Exam: General: Resting in bed, in no acute distress  Head:  Moist oral mucosal membranes  Eyes: Anicteric  Lungs:   Dyspnea on mild exertion,On 6LO2 via [place], Lungs with rhonchi  Heart: S1S2, no rubs or gallops,Regular   Abdomen:  Soft, nontender, non distended  Extremities:  No  peripheral edema.  Neurologic: Oriented x 3, flat affect  Skin: No acute lesions or rashes  Access: Peritoneal  catheter    Basic Metabolic Panel: Recent Labs  Lab 10/13/2020 1213  NA 136  K 4.8  CL 95*  CO2 23  GLUCOSE 126*  BUN 96*  CREATININE 10.53*  CALCIUM 6.8*    Liver Function Tests: No results for input(s): AST, ALT, ALKPHOS, BILITOT, PROT, ALBUMIN in the last 168 hours. No results for input(s): LIPASE, AMYLASE in the last 168 hours. No results for input(s): AMMONIA in the last 168 hours.  CBC: Recent Labs  Lab 10/13/2020 1213  WBC 5.9  NEUTROABS 4.4  HGB 13.9  HCT 40.6  MCV 93.8  PLT 107*    Cardiac Enzymes: No results for input(s): CKTOTAL, CKMB, CKMBINDEX, TROPONINI in the last 168 hours.  BNP: Invalid input(s): POCBNP  CBG: No results for input(s): GLUCAP in the last 168 hours.  Microbiology: Results for orders placed or performed during the hospital encounter  of 11/01/2020  Respiratory Panel by RT PCR (Flu A&B, Covid) - Nasopharyngeal Swab     Status: Abnormal   Collection Time: 11/11/2020  1:16 PM   Specimen: Nasopharyngeal Swab  Result Value Ref Range Status   SARS Coronavirus 2 by RT PCR POSITIVE (A) NEGATIVE Final    Comment: RESULT CALLED TO, READ BACK BY AND VERIFIED WITH: REID RENO 11/08/2020 1433 KLW (NOTE) SARS-CoV-2 target nucleic acids are DETECTED.  SARS-CoV-2 RNA is generally detectable in upper respiratory specimens  during the acute phase of infection. Positive results are indicative of the presence of the identified virus, but do not rule out bacterial infection or co-infection with other pathogens not detected by the test. Clinical correlation with patient history and other diagnostic information is necessary to determine patient infection status. The expected result is Negative.  Fact Sheet for Patients:  PinkCheek.be  Fact Sheet for Healthcare Providers: GravelBags.it  This test is not yet approved or cleared by the [place] FDA and  has been authorized for detection and/or diagnosis of SARS-CoV-2 by FDA under an Emergency Use Authorization (EUA).  This EUA will remain in effect (meaning this test can be used) fo r the duration of  the COVID-19 declaration under Section 564(b)(1) of the Act, 21 U.S.C. section 360bbb-3(b)(1), unless the authorization is terminated or revoked sooner.      Influenza A by PCR NEGATIVE NEGATIVE Final   Influenza  B by PCR NEGATIVE NEGATIVE Final    Comment: (NOTE) The Xpert Xpress SARS-CoV-2/FLU/RSV assay is intended as an aid in  the diagnosis of influenza from Nasopharyngeal swab specimens and  should not be used as a sole basis for treatment. Nasal washings and  aspirates are unacceptable for Xpert Xpress SARS-CoV-2/FLU/RSV  testing.  Fact Sheet for Patients: PinkCheek.be  Fact Sheet for  Healthcare Providers: GravelBags.it  This test is not yet approved or cleared by the [place] FDA and  has been authorized for detection and/or diagnosis of SARS-CoV-2 by  FDA under an Emergency Use Authorization (EUA). This EUA will remain  in effect (meaning this test can be used) for the duration of the  Covid-19 declaration under Section 564(b)(1) of the Act, 21  U.S.C. section 360bbb-3(b)(1), unless the authorization is  terminated or revoked. Performed at [facility], [address] [place], [place] [address]     Coagulation Studies: No results for input(s): LABPROT, INR in the last 72 hours.  Urinalysis: No results for input(s): COLORURINE, LABSPEC, PHURINE, GLUCOSEU, HGBUR, BILIRUBINUR, KETONESUR, PROTEINUR, UROBILINOGEN, NITRITE, LEUKOCYTESUR in the last 72 hours.  Invalid input(s): APPERANCEUR    Imaging: DG Chest Portable 1 View  Result Date: 10/29/2020 CLINICAL DATA:  Shortness of breath, hypoxia, history COPD, evaluate infiltrate EXAM: PORTABLE CHEST 1 VIEW COMPARISON:  Portable exam 2841 hours compared to 03/01/2011 FINDINGS: Normal heart size, mediastinal contours, and pulmonary vascularity. Atherosclerotic calcification aorta. Bibasilar infiltrates consistent with multifocal pneumonia. No pleural effusion or pneumothorax. Osseous structures unremarkable. IMPRESSION: Bibasilar pulmonary infiltrates consistent with multifocal pneumonia. Electronically Signed   By: Lavonia Dana M.D.   On: 10/15/2020 13:10     Medications:   . [START ON 10/23/2020] remdesivir 100 mg in NS 100 mL     . aspirin  81 mg Oral Daily  . atorvastatin  80 mg Oral Daily  . calcitRIOL  0.25 mcg Oral Daily  . calcium carbonate  1 tablet Oral BID WC  . carvedilol  25 mg Oral BID  . dexamethasone (DECADRON) injection  6 mg Intravenous Q24H  . heparin  5,000 Units Subcutaneous Q8H  . Ipratropium-Albuterol  1 puff Inhalation BID  . isosorbide  mononitrate  30 mg Oral Daily  . mouth rinse  15 mL Mouth Rinse BID  . torsemide  100 mg Oral Daily     Assessment/ Plan:  [name] is a 64 y.o.  male  has h/o COPD,CAD and ESRD on peritoneal dialysis.He came to ED with SOB, diagnosed with COVID 19 infection.   # ESRD on peritoneal dialysis  Patient appears euvolemic Planning for peritoneal dialysis tonight  #Secondary hyperparathyroidism Patient is on Calcitriol and Calcium Carbonate Will continue monitoring labs  #Hypertension Blood pressure readings within acceptable range Continue current antihypertensive regimen  # Covid 19 infection  Patient is in no acute respiratory distress On 6L O2 via nasal canula Receiving Dexamethasone 6 mg IV Q24 hours  LOS: 0 Dwaine Pringle 11/10/20211:01 PM

## 2020-10-23 DIAGNOSIS — J9601 Acute respiratory failure with hypoxia: Secondary | ICD-10-CM | POA: Diagnosis not present

## 2020-10-23 DIAGNOSIS — U071 COVID-19: Secondary | ICD-10-CM | POA: Diagnosis not present

## 2020-10-23 LAB — HEMOGLOBIN A1C
Hgb A1c MFr Bld: 6.1 % — ABNORMAL HIGH (ref 4.8–5.6)
Mean Plasma Glucose: 128.37 mg/dL

## 2020-10-23 LAB — CBC
HCT: 36.9 % — ABNORMAL LOW (ref 39.0–52.0)
Hemoglobin: 12.8 g/dL — ABNORMAL LOW (ref 13.0–17.0)
MCH: 31.7 pg (ref 26.0–34.0)
MCHC: 34.7 g/dL (ref 30.0–36.0)
MCV: 91.3 fL (ref 80.0–100.0)
Platelets: 139 10*3/uL — ABNORMAL LOW (ref 150–400)
RBC: 4.04 MIL/uL — ABNORMAL LOW (ref 4.22–5.81)
RDW: 12.5 % (ref 11.5–15.5)
WBC: 18.8 10*3/uL — ABNORMAL HIGH (ref 4.0–10.5)
nRBC: 0 % (ref 0.0–0.2)

## 2020-10-23 LAB — BASIC METABOLIC PANEL
Anion gap: 22 — ABNORMAL HIGH (ref 5–15)
BUN: 130 mg/dL — ABNORMAL HIGH (ref 8–23)
CO2: 19 mmol/L — ABNORMAL LOW (ref 22–32)
Calcium: 7.2 mg/dL — ABNORMAL LOW (ref 8.9–10.3)
Chloride: 92 mmol/L — ABNORMAL LOW (ref 98–111)
Creatinine, Ser: 10.98 mg/dL — ABNORMAL HIGH (ref 0.61–1.24)
GFR, Estimated: 5 mL/min — ABNORMAL LOW (ref >60)
Glucose, Bld: 229 mg/dL — ABNORMAL HIGH (ref 70–99)
Potassium: 4.5 mmol/L (ref 3.5–5.1)
Sodium: 133 mmol/L — ABNORMAL LOW (ref 135–145)

## 2020-10-23 LAB — MAGNESIUM: Magnesium: 1.9 mg/dL (ref 1.7–2.4)

## 2020-10-23 MED ORDER — DEXAMETHASONE 4 MG PO TABS
6.0000 mg | ORAL_TABLET | Freq: Every day | ORAL | Status: DC
  Administered 2020-10-24 – 2020-10-27 (×3): 6 mg via ORAL
  Filled 2020-10-23 (×2): qty 2
  Filled 2020-10-23: qty 1.5
  Filled 2020-10-23: qty 2

## 2020-10-23 NOTE — Progress Notes (Signed)
PROGRESS NOTE    Willie Hahn  EUM:[id number] DOB: 09-27-1956 DOA: 10/16/2020 PCP: Birdie Sons, MD    Assessment & Plan:   Principal Problem:   Acute hypoxemic respiratory failure due to COVID-19 [facility]) Active Problems:   Coronary artery disease   Essential hypertension   Insomnia   Compulsive tobacco user syndrome   H/O placement of stent in anterior descending branch of left coronary artery   Mixed hyperlipidemia   CKD (chronic kidney disease) stage V requiring chronic dialysis ([place])   Focal segmental glomerulosclerosis   Pneumonia due to COVID-19 virus    Willie Hahn is a 64 y.o. male with medical history significant for hypertension, hyperlipidemia, gout in remission, end-stage renal disease on peritoneal dialysis presented to the emergency department for chief concerns of shortness of breath x6 days worsening with minimal improvement with home inhaler for his COPD.   Acute hypoxic respiratory failure secondary to COVID-19 PNA -Status post Solu-Medrol 125 mg IV per ED provider --O2 requirement up to 6L currently --CRP 7.7 PLAN: --cont Remdesivir --cont steroid as decadron --cont combivent -Continue with oxygen therapy to maintain SPO2 greater than 92%, incentive spirometry every hour while awake, flutter valve every 2 hours while awake  CAD -appears stable at this time no clinical indication for ACS --on home atorvastatin 80 mg daily, Coreg 25 mg twice daily, Imdur 30 mg daily, aspirin 81 mg daily --cont home regimen except holding Imdur since BP low normal  COPD  -Tiotropium nebs at home is held -cont Combivent as QID -Albuterol PRN q4h for shortness of breath and wheezing  Hypertension  --BP low normal -On home amlodipine 10 mg daily, carvedilol 25 mg bid, hydralazine 50 mg TID, losartan-hctz 100-12.5 daily at home, torsemide PLAN: --cont home coreg and torsemide --Hold the rest due to low normal BP  ESRD on peritoneal dialysis -Status post 500  cc LR bolus in the ED -Patient endorses compliance with peritoneal dialysis -Nephrology has been consulted -Patient is still urinating PLAN: --Hold IVF and encourage oral hydration --cont home torsemide 100 mg daily --cont inpatient peritoneal dialysis, per nephrology  Hyperlipidemia -cont atorvastatin 80 mg daily  Tobacco user -patient not ready to quit  Hyperglycemia, likely due to steroid use --obtain A1c   DVT prophylaxis: Heparin SQ Code Status: Limited code  Family Communication:  Status is: inpatient Dispo:   The patient is from: home Anticipated d/c is to: home Anticipated d/c date is: >3 days Patient currently is not medically stable to d/c due to: COVID PNA, 7L O2.   Subjective and Interval History:  Pt reported breathing better.  Michela Pitcher he was trying to work on going to the bathroom.   Objective: Vitals:   10/23/20 0454 10/23/20 0500 10/23/20 0839 10/23/20 1125  BP: 107/60  124/66 123/62  Pulse: 71  78 68  Resp: (!) 22  18 17   Temp: 97.6 F (36.4 C)  97.6 F (36.4 C) 97.7 F (36.5 C)  TempSrc: Oral  Oral Oral  SpO2: 92%  95% 98%  Weight:  78.2 kg    Height:       No intake or output data in the 24 hours ending 10/23/20 1517 Filed Weights   10/28/2020 1213 10/20/2020 2105 10/23/20 0500  Weight: 81.6 kg 78 kg 78.2 kg    Examination:   Constitutional: NAD, AAOx3, sitting up side of bed eating HEENT: conjunctivae and lids normal, EOMI CV: No cyanosis.   RESP: normal respiratory effort, on 7L Extremities: No effusions, edema  in BLE SKIN: warm, dry and intact Neuro: II - XII grossly intact.   Psych: Normal mood and affect.  Appropriate judgement and reason   Data Reviewed: I have personally reviewed following labs and imaging studies  CBC: Recent Labs  Lab 10/23/2020 1213 10/23/20 0549  WBC 5.9 18.8*  NEUTROABS 4.4  --   HGB 13.9 12.8*  HCT 40.6 36.9*  MCV 93.8 91.3  PLT 107* 659*   Basic Metabolic Panel: Recent Labs  Lab  10/26/2020 1213 10/23/20 0549  NA 136 133*  K 4.8 4.5  CL 95* 92*  CO2 23 19*  GLUCOSE 126* 229*  BUN 96* 130*  CREATININE 10.53* 10.98*  CALCIUM 6.8* 7.2*  MG  --  1.9   GFR: Estimated Creatinine Clearance: 6.9 mL/min (A) (by C-G formula based on SCr of 10.98 mg/dL (H)). Liver Function Tests: Recent Labs  Lab 10/22/20 1431  AST 52*  ALT 21  ALKPHOS 55  BILITOT 0.7  PROT 5.8*  ALBUMIN 2.7*   No results for input(s): LIPASE, AMYLASE in the last 168 hours. No results for input(s): AMMONIA in the last 168 hours. Coagulation Profile: No results for input(s): INR, PROTIME in the last 168 hours. Cardiac Enzymes: No results for input(s): CKTOTAL, CKMB, CKMBINDEX, TROPONINI in the last 168 hours. BNP (last 3 results) No results for input(s): PROBNP in the last 8760 hours. HbA1C: No results for input(s): HGBA1C in the last 72 hours. CBG: No results for input(s): GLUCAP in the last 168 hours. Lipid Profile: No results for input(s): CHOL, HDL, LDLCALC, TRIG, CHOLHDL, LDLDIRECT in the last 72 hours. Thyroid Function Tests: No results for input(s): TSH, T4TOTAL, FREET4, T3FREE, THYROIDAB in the last 72 hours. Anemia Panel: No results for input(s): VITAMINB12, FOLATE, FERRITIN, TIBC, IRON, RETICCTPCT in the last 72 hours. Sepsis Labs: No results for input(s): PROCALCITON, LATICACIDVEN in the last 168 hours.  Recent Results (from the past 240 hour(s))  Respiratory Panel by RT PCR (Flu A&B, Covid) - Nasopharyngeal Swab     Status: Abnormal   Collection Time: 11/08/2020  1:16 PM   Specimen: Nasopharyngeal Swab  Result Value Ref Range Status   SARS Coronavirus 2 by RT PCR POSITIVE (A) NEGATIVE Final    Comment: RESULT CALLED TO, READ BACK BY AND VERIFIED WITH: REID RENO 10/13/2020 1433 KLW (NOTE) SARS-CoV-2 target nucleic acids are DETECTED.  SARS-CoV-2 RNA is generally detectable in upper respiratory specimens  during the acute phase of infection. Positive results are  indicative of the presence of the identified virus, but do not rule out bacterial infection or co-infection with other pathogens not detected by the test. Clinical correlation with patient history and other diagnostic information is necessary to determine patient infection status. The expected result is Negative.  Fact Sheet for Patients:  PinkCheek.be  Fact Sheet for Healthcare Providers: GravelBags.it  This test is not yet approved or cleared by the [place] FDA and  has been authorized for detection and/or diagnosis of SARS-CoV-2 by FDA under an Emergency Use Authorization (EUA).  This EUA will remain in effect (meaning this test can be used) fo r the duration of  the COVID-19 declaration under Section 564(b)(1) of the Act, 21 U.S.C. section 360bbb-3(b)(1), unless the authorization is terminated or revoked sooner.      Influenza A by PCR NEGATIVE NEGATIVE Final   Influenza B by PCR NEGATIVE NEGATIVE Final    Comment: (NOTE) The Xpert Xpress SARS-CoV-2/FLU/RSV assay is intended as an aid in  the diagnosis of  influenza from Nasopharyngeal swab specimens and  should not be used as a sole basis for treatment. Nasal washings and  aspirates are unacceptable for Xpert Xpress SARS-CoV-2/FLU/RSV  testing.  Fact Sheet for Patients: PinkCheek.be  Fact Sheet for Healthcare Providers: GravelBags.it  This test is not yet approved or cleared by the [place] FDA and  has been authorized for detection and/or diagnosis of SARS-CoV-2 by  FDA under an Emergency Use Authorization (EUA). This EUA will remain  in effect (meaning this test can be used) for the duration of the  Covid-19 declaration under Section 564(b)(1) of the Act, 21  U.S.C. section 360bbb-3(b)(1), unless the authorization is  terminated or revoked. Performed at [facility], [address] [place], [place] [address]       Radiology Studies: No results found.   Scheduled Meds: . aspirin  81 mg Oral Daily  . atorvastatin  80 mg Oral Daily  . calcitRIOL  0.25 mcg Oral Daily  . calcium carbonate  1 tablet Oral BID WC  . carvedilol  25 mg Oral BID  . dexamethasone (DECADRON) injection  6 mg Intravenous Q24H  . gentamicin cream  1 application Topical Daily  . heparin  5,000 Units Subcutaneous Q8H  . Ipratropium-Albuterol  1 puff Inhalation QID  . mouth rinse  15 mL Mouth Rinse BID  . torsemide  100 mg Oral Daily   Continuous Infusions: . dialysis solution 1.5% low-MG/low-CA    . remdesivir 100 mg in NS 100 mL 100 mg (10/23/20 0908)     LOS: 1 day     Enzo Bi, MD [facility] If 7PM-7AM, please contact night-coverage 10/23/2020, 3:17 PM

## 2020-10-23 NOTE — Plan of Care (Signed)
°  Problem: Education: Goal: Knowledge of risk factors and measures for prevention of condition will improve Outcome: Progressing   Problem: Coping: Goal: Psychosocial and spiritual needs will be supported Outcome: Progressing   Problem: Respiratory: Goal: Will maintain a patent airway Outcome: Progressing Goal: Complications related to the disease process, condition or treatment will be avoided or minimized Outcome: Progressing   Problem: Education: Goal: Knowledge of General Education information will improve Description: Including pain rating scale, medication(s)/side effects and non-pharmacologic comfort measures Outcome: Progressing   Problem: Clinical Measurements: Goal: Ability to maintain clinical measurements within normal limits will improve Outcome: Progressing Goal: Will remain free from infection Outcome: Progressing Goal: Diagnostic test results will improve Outcome: Progressing Goal: Respiratory complications will improve Outcome: Progressing Goal: Cardiovascular complication will be avoided Outcome: Progressing   Problem: Elimination: Goal: Will not experience complications related to bowel motility Outcome: Progressing Goal: Will not experience complications related to urinary retention Outcome: Progressing   Problem: Pain Managment: Goal: General experience of comfort will improve Outcome: Progressing   Problem: Safety: Goal: Ability to remain free from injury will improve Outcome: Progressing   Problem: Skin Integrity: Goal: Risk for impaired skin integrity will decrease Outcome: Progressing

## 2020-10-23 NOTE — Progress Notes (Signed)
Peritoneal dialysis patient known at [facility]. There will be changes to treatment due to being Covid positive, since patient treats at home. Please contact me with dialysis placement concerns.  Elvera Bicker Dialysis Coordinator [phone]

## 2020-10-23 NOTE — Progress Notes (Signed)
[facility]  ROUNDING NOTE   Subjective:  Patient resting in bed, in no acute distress. He received his peritoneal dialysis treatment last night,tolerated well.  Objective:  Vital signs in last 24 hours:  Temp:  [97.6 F (36.4 C)-98.5 F (36.9 C)] 97.6 F (36.4 C) (11/11 0839) Pulse Rate:  [71-83] 78 (11/11 0839) Resp:  [16-22] 18 (11/11 0839) BP: (107-124)/(54-66) 124/66 (11/11 0839) SpO2:  [92 %-95 %] 95 % (11/11 0839) Weight:  [78.2 kg] 78.2 kg (11/11 0500)  Weight change: -3.447 kg Filed Weights   10/29/2020 1213 10/19/2020 2105 10/23/20 0500  Weight: 81.6 kg 78 kg 78.2 kg    Intake/Output: No intake/output data recorded.   Intake/Output this shift:  No intake/output data recorded.  Physical Exam: General: In no acute distress  Head: Normocephalic,atraumatic  Eyes: Anicteric  Lungs:  Respirations even,unlabored,On 6LO2 via [place], Lungs clear  Heart: Regular rate and rhythm  Abdomen:  Soft, nontender, non distended  Extremities:  No  peripheral edema.  Neurologic: Awake,alert,oriented  Skin: No acute lesions or rashes  Access: Peritoneal  catheter    Basic Metabolic Panel: Recent Labs  Lab 11/09/2020 1213 10/23/20 0549  NA 136 133*  K 4.8 4.5  CL 95* 92*  CO2 23 19*  GLUCOSE 126* 229*  BUN 96* 130*  CREATININE 10.53* 10.98*  CALCIUM 6.8* 7.2*  MG  --  1.9    Liver Function Tests: Recent Labs  Lab 10/22/20 1431  AST 52*  ALT 21  ALKPHOS 55  BILITOT 0.7  PROT 5.8*  ALBUMIN 2.7*   No results for input(s): LIPASE, AMYLASE in the last 168 hours. No results for input(s): AMMONIA in the last 168 hours.  CBC: Recent Labs  Lab 11/07/2020 1213 10/23/20 0549  WBC 5.9 18.8*  NEUTROABS 4.4  --   HGB 13.9 12.8*  HCT 40.6 36.9*  MCV 93.8 91.3  PLT 107* 139*    Cardiac Enzymes: No results for input(s): CKTOTAL, CKMB, CKMBINDEX, TROPONINI in the last 168 hours.  BNP: Invalid input(s): POCBNP  CBG: No results for input(s): GLUCAP in the  last 168 hours.  Microbiology: Results for orders placed or performed during the hospital encounter of 10/28/2020  Respiratory Panel by RT PCR (Flu A&B, Covid) - Nasopharyngeal Swab     Status: Abnormal   Collection Time: 10/18/2020  1:16 PM   Specimen: Nasopharyngeal Swab  Result Value Ref Range Status   SARS Coronavirus 2 by RT PCR POSITIVE (A) NEGATIVE Final    Comment: RESULT CALLED TO, READ BACK BY AND VERIFIED WITH: REID RENO 11/02/2020 1433 KLW (NOTE) SARS-CoV-2 target nucleic acids are DETECTED.  SARS-CoV-2 RNA is generally detectable in upper respiratory specimens  during the acute phase of infection. Positive results are indicative of the presence of the identified virus, but do not rule out bacterial infection or co-infection with other pathogens not detected by the test. Clinical correlation with patient history and other diagnostic information is necessary to determine patient infection status. The expected result is Negative.  Fact Sheet for Patients:  PinkCheek.be  Fact Sheet for Healthcare Providers: GravelBags.it  This test is not yet approved or cleared by the [place] FDA and  has been authorized for detection and/or diagnosis of SARS-CoV-2 by FDA under an Emergency Use Authorization (EUA).  This EUA will remain in effect (meaning this test can be used) fo r the duration of  the COVID-19 declaration under Section 564(b)(1) of the Act, 21 U.S.C. section 360bbb-3(b)(1), unless the authorization  is terminated or revoked sooner.      Influenza A by PCR NEGATIVE NEGATIVE Final   Influenza B by PCR NEGATIVE NEGATIVE Final    Comment: (NOTE) The Xpert Xpress SARS-CoV-2/FLU/RSV assay is intended as an aid in  the diagnosis of influenza from Nasopharyngeal swab specimens and  should not be used as a sole basis for treatment. Nasal washings and  aspirates are unacceptable for Xpert Xpress SARS-CoV-2/FLU/RSV   testing.  Fact Sheet for Patients: PinkCheek.be  Fact Sheet for Healthcare Providers: GravelBags.it  This test is not yet approved or cleared by the [place] FDA and  has been authorized for detection and/or diagnosis of SARS-CoV-2 by  FDA under an Emergency Use Authorization (EUA). This EUA will remain  in effect (meaning this test can be used) for the duration of the  Covid-19 declaration under Section 564(b)(1) of the Act, 21  U.S.C. section 360bbb-3(b)(1), unless the authorization is  terminated or revoked. Performed at [facility], [address] [place], [place] [address]     Coagulation Studies: No results for input(s): LABPROT, INR in the last 72 hours.  Urinalysis: No results for input(s): COLORURINE, LABSPEC, PHURINE, GLUCOSEU, HGBUR, BILIRUBINUR, KETONESUR, PROTEINUR, UROBILINOGEN, NITRITE, LEUKOCYTESUR in the last 72 hours.  Invalid input(s): APPERANCEUR    Imaging: DG Chest Portable 1 View  Result Date: 11/02/2020 CLINICAL DATA:  Shortness of breath, hypoxia, history COPD, evaluate infiltrate EXAM: PORTABLE CHEST 1 VIEW COMPARISON:  Portable exam 6599 hours compared to 03/01/2011 FINDINGS: Normal heart size, mediastinal contours, and pulmonary vascularity. Atherosclerotic calcification aorta. Bibasilar infiltrates consistent with multifocal pneumonia. No pleural effusion or pneumothorax. Osseous structures unremarkable. IMPRESSION: Bibasilar pulmonary infiltrates consistent with multifocal pneumonia. Electronically Signed   By: Lavonia Dana M.D.   On: 11/02/2020 13:10     Medications:   . dialysis solution 1.5% low-MG/low-CA    . remdesivir 100 mg in NS 100 mL 100 mg (10/23/20 0908)   . aspirin  81 mg Oral Daily  . atorvastatin  80 mg Oral Daily  . calcitRIOL  0.25 mcg Oral Daily  . calcium carbonate  1 tablet Oral BID WC  . carvedilol  25 mg Oral BID  . dexamethasone (DECADRON)  injection  6 mg Intravenous Q24H  . gentamicin cream  1 application Topical Daily  . heparin  5,000 Units Subcutaneous Q8H  . Ipratropium-Albuterol  1 puff Inhalation QID  . mouth rinse  15 mL Mouth Rinse BID  . torsemide  100 mg Oral Daily     Assessment/ Plan:  Willie Hahn is a 65 y.o.  male  has h/o COPD,CAD and ESRD on peritoneal dialysis.He came to ED with SOB, diagnosed with COVID 19 infection.   # ESRD on peritoneal dialysis  Patient received his peritoneal dialysis treatment last night No issues with treatment,tolerated well. Will plan for PD again tonight  #Secondary hyperparathyroidism Calcium 7.2 Continue Calcitriol and Calcium Carbonate Will monitor bone mineral metabolism parameters  #Hypertension Normotensive Patient is on Carvedilol and Torsemide  # Covid 19 infection  Patient is on Remdesivir and Decadron Respiratory status improving, still on 6L of O2 via nasal cannula.   LOS: 1 Amybeth Sieg 11/11/202111:20 AM

## 2020-10-23 NOTE — Plan of Care (Signed)
  Problem: Education: Goal: Knowledge of risk factors and measures for prevention of condition will improve Outcome: Progressing   Problem: Coping: Goal: Psychosocial and spiritual needs will be supported Outcome: Progressing   Problem: Respiratory: Goal: Will maintain a patent airway Outcome: Progressing Goal: Complications related to the disease process, condition or treatment will be avoided or minimized Outcome: Progressing   Problem: Education: Goal: Knowledge of General Education information will improve Description: Including pain rating scale, medication(s)/side effects and non-pharmacologic comfort measures Outcome: Progressing   Problem: Clinical Measurements: Goal: Ability to maintain clinical measurements within normal limits will improve Outcome: Progressing Goal: Will remain free from infection Outcome: Progressing Goal: Diagnostic test results will improve Outcome: Progressing Goal: Respiratory complications will improve Outcome: Progressing Goal: Cardiovascular complication will be avoided Outcome: Progressing   Problem: Elimination: Goal: Will not experience complications related to bowel motility Outcome: Progressing Goal: Will not experience complications related to urinary retention Outcome: Progressing   Problem: Pain Managment: Goal: General experience of comfort will improve Outcome: Progressing   Problem: Safety: Goal: Ability to remain free from injury will improve Outcome: Progressing   Problem: Skin Integrity: Goal: Risk for impaired skin integrity will decrease Outcome: Progressing

## 2020-10-24 DIAGNOSIS — J9601 Acute respiratory failure with hypoxia: Secondary | ICD-10-CM | POA: Diagnosis not present

## 2020-10-24 DIAGNOSIS — U071 COVID-19: Secondary | ICD-10-CM | POA: Diagnosis not present

## 2020-10-24 LAB — C-REACTIVE PROTEIN: CRP: 3.7 mg/dL — ABNORMAL HIGH (ref <1.0)

## 2020-10-24 LAB — BASIC METABOLIC PANEL
Anion gap: 21 — ABNORMAL HIGH (ref 5–15)
BUN: 152 mg/dL — ABNORMAL HIGH (ref 8–23)
CO2: 19 mmol/L — ABNORMAL LOW (ref 22–32)
Calcium: 7.2 mg/dL — ABNORMAL LOW (ref 8.9–10.3)
Chloride: 94 mmol/L — ABNORMAL LOW (ref 98–111)
Creatinine, Ser: 10.93 mg/dL — ABNORMAL HIGH (ref 0.61–1.24)
GFR, Estimated: 5 mL/min — ABNORMAL LOW (ref >60)
Glucose, Bld: 229 mg/dL — ABNORMAL HIGH (ref 70–99)
Potassium: 4.4 mmol/L (ref 3.5–5.1)
Sodium: 134 mmol/L — ABNORMAL LOW (ref 135–145)

## 2020-10-24 LAB — CBC
HCT: 35.9 % — ABNORMAL LOW (ref 39.0–52.0)
Hemoglobin: 12.7 g/dL — ABNORMAL LOW (ref 13.0–17.0)
MCH: 32.2 pg (ref 26.0–34.0)
MCHC: 35.4 g/dL (ref 30.0–36.0)
MCV: 90.9 fL (ref 80.0–100.0)
Platelets: 127 10*3/uL — ABNORMAL LOW (ref 150–400)
RBC: 3.95 MIL/uL — ABNORMAL LOW (ref 4.22–5.81)
RDW: 12.6 % (ref 11.5–15.5)
WBC: 17.9 10*3/uL — ABNORMAL HIGH (ref 4.0–10.5)
nRBC: 0 % (ref 0.0–0.2)

## 2020-10-24 LAB — GLUCOSE, CAPILLARY
Glucose-Capillary: 182 mg/dL — ABNORMAL HIGH (ref 70–99)
Glucose-Capillary: 208 mg/dL — ABNORMAL HIGH (ref 70–99)
Glucose-Capillary: 159 mg/dL — ABNORMAL HIGH (ref 70–99)

## 2020-10-24 LAB — MAGNESIUM: Magnesium: 1.9 mg/dL (ref 1.7–2.4)

## 2020-10-24 MED ORDER — INSULIN ASPART 100 UNIT/ML ~~LOC~~ SOLN
0.0000 [IU] | Freq: Every day | SUBCUTANEOUS | Status: DC
  Administered 2020-10-30: 3 [IU] via SUBCUTANEOUS
  Filled 2020-10-24 (×2): qty 1

## 2020-10-24 MED ORDER — INSULIN ASPART 100 UNIT/ML ~~LOC~~ SOLN
0.0000 [IU] | Freq: Three times a day (TID) | SUBCUTANEOUS | Status: DC
  Administered 2020-10-24: 17:00:00 5 [IU] via SUBCUTANEOUS
  Administered 2020-10-24 – 2020-10-26 (×5): 3 [IU] via SUBCUTANEOUS
  Administered 2020-10-26 – 2020-10-27 (×2): 2 [IU] via SUBCUTANEOUS
  Administered 2020-10-27: 3 [IU] via SUBCUTANEOUS
  Administered 2020-10-27 – 2020-10-28 (×2): 2 [IU] via SUBCUTANEOUS
  Administered 2020-10-28: 3 [IU] via SUBCUTANEOUS
  Administered 2020-10-29: 2 [IU] via SUBCUTANEOUS
  Administered 2020-10-30: 3 [IU] via SUBCUTANEOUS
  Administered 2020-10-30: 5 [IU] via SUBCUTANEOUS
  Administered 2020-10-30: 3 [IU] via SUBCUTANEOUS
  Administered 2020-10-31: 5 [IU] via SUBCUTANEOUS
  Administered 2020-10-31: 8 [IU] via SUBCUTANEOUS
  Filled 2020-10-24 (×18): qty 1

## 2020-10-24 MED ORDER — PHENOL 1.4 % MT LIQD
1.0000 | OROMUCOSAL | Status: DC | PRN
  Administered 2020-10-25: 1 via OROMUCOSAL
  Filled 2020-10-24 (×2): qty 177

## 2020-10-24 NOTE — Care Management Important Message (Signed)
Important Message  Patient Details  Name: MANDY FITZWATER MRN: [id number] Date of Birth: 05-Jan-1956   Medicare Important Message Given:  Yes  IM left for RN to deliver due to isolation status.   [place], LCSW 10/24/2020, 12:28 PM

## 2020-10-24 NOTE — Progress Notes (Signed)
[facility]  ROUNDING NOTE   Subjective:  Patient resting in bed, in no acute distress. He received his peritoneal dialysis treatment last night,tolerated well.  Objective:  Vital signs in last 24 hours:  Temp:  [97.4 F (36.3 C)-98.1 F (36.7 C)] 97.4 F (36.3 C) (11/12 1217) Pulse Rate:  [65-70] 70 (11/12 1217) Resp:  [18-20] 20 (11/12 1217) BP: (111-145)/(61-80) 129/75 (11/12 1217) SpO2:  [88 %-97 %] 90 % (11/12 1217) Weight:  [78.2 kg] 78.2 kg (11/12 0500)  Weight change: 0 kg Filed Weights   10/13/2020 2105 10/23/20 0500 10/24/20 0500  Weight: 78 kg 78.2 kg 78.2 kg    Intake/Output: I/O last 3 completed shifts: In: 16100.1 [Other:16000; IV Piggyback:100.1] Out: 1740 [Other:8115]   Intake/Output this shift:  No intake/output data recorded.  Physical Exam: General: In no acute distress  Head: Normocephalic,atraumatic  Eyes: Anicteric  Lungs:  Respirations even,unlabored,On 6LO2 via [place], Lungs clear  Heart: Regular rate and rhythm  Abdomen:  Soft, nontender, non distended  Extremities:  No  peripheral edema.  Neurologic: Awake,alert,oriented  Skin: No acute lesions or rashes  Access: Peritoneal  catheter    Basic Metabolic Panel: Recent Labs  Lab 11/01/2020 1213 10/23/20 0549 10/24/20 0522  NA 136 133* 134*  K 4.8 4.5 4.4  CL 95* 92* 94*  CO2 23 19* 19*  GLUCOSE 126* 229* 229*  BUN 96* 130* 152*  CREATININE 10.53* 10.98* 10.93*  CALCIUM 6.8* 7.2* 7.2*  MG  --  1.9 1.9    Liver Function Tests: Recent Labs  Lab 10/22/20 1431  AST 52*  ALT 21  ALKPHOS 55  BILITOT 0.7  PROT 5.8*  ALBUMIN 2.7*   No results for input(s): LIPASE, AMYLASE in the last 168 hours. No results for input(s): AMMONIA in the last 168 hours.  CBC: Recent Labs  Lab 10/25/2020 1213 10/23/20 0549 10/24/20 0522  WBC 5.9 18.8* 17.9*  NEUTROABS 4.4  --   --   HGB 13.9 12.8* 12.7*  HCT 40.6 36.9* 35.9*  MCV 93.8 91.3 90.9  PLT 107* 139* 127*    Cardiac  Enzymes: No results for input(s): CKTOTAL, CKMB, CKMBINDEX, TROPONINI in the last 168 hours.  BNP: Invalid input(s): POCBNP  CBG: Recent Labs  Lab 10/24/20 1305  GLUCAP 182*    Microbiology: Results for orders placed or performed during the hospital encounter of 11/09/2020  Respiratory Panel by RT PCR (Flu A&B, Covid) - Nasopharyngeal Swab     Status: Abnormal   Collection Time: 10/30/2020  1:16 PM   Specimen: Nasopharyngeal Swab  Result Value Ref Range Status   SARS Coronavirus 2 by RT PCR POSITIVE (A) NEGATIVE Final    Comment: RESULT CALLED TO, READ BACK BY AND VERIFIED WITH: REID RENO 10/28/2020 1433 KLW (NOTE) SARS-CoV-2 target nucleic acids are DETECTED.  SARS-CoV-2 RNA is generally detectable in upper respiratory specimens  during the acute phase of infection. Positive results are indicative of the presence of the identified virus, but do not rule out bacterial infection or co-infection with other pathogens not detected by the test. Clinical correlation with patient history and other diagnostic information is necessary to determine patient infection status. The expected result is Negative.  Fact Sheet for Patients:  PinkCheek.be  Fact Sheet for Healthcare Providers: GravelBags.it  This test is not yet approved or cleared by the [place] FDA and  has been authorized for detection and/or diagnosis of SARS-CoV-2 by FDA under an Emergency Use Authorization (EUA).  This EUA will  remain in effect (meaning this test can be used) fo r the duration of  the COVID-19 declaration under Section 564(b)(1) of the Act, 21 U.S.C. section 360bbb-3(b)(1), unless the authorization is terminated or revoked sooner.      Influenza A by PCR NEGATIVE NEGATIVE Final   Influenza B by PCR NEGATIVE NEGATIVE Final    Comment: (NOTE) The Xpert Xpress SARS-CoV-2/FLU/RSV assay is intended as an aid in  the diagnosis of influenza from  Nasopharyngeal swab specimens and  should not be used as a sole basis for treatment. Nasal washings and  aspirates are unacceptable for Xpert Xpress SARS-CoV-2/FLU/RSV  testing.  Fact Sheet for Patients: PinkCheek.be  Fact Sheet for Healthcare Providers: GravelBags.it  This test is not yet approved or cleared by the [place] FDA and  has been authorized for detection and/or diagnosis of SARS-CoV-2 by  FDA under an Emergency Use Authorization (EUA). This EUA will remain  in effect (meaning this test can be used) for the duration of the  Covid-19 declaration under Section 564(b)(1) of the Act, 21  U.S.C. section 360bbb-3(b)(1), unless the authorization is  terminated or revoked. Performed at [facility], [address] [place], [place] [address]     Coagulation Studies: No results for input(s): LABPROT, INR in the last 72 hours.  Urinalysis: No results for input(s): COLORURINE, LABSPEC, PHURINE, GLUCOSEU, HGBUR, BILIRUBINUR, KETONESUR, PROTEINUR, UROBILINOGEN, NITRITE, LEUKOCYTESUR in the last 72 hours.  Invalid input(s): APPERANCEUR    Imaging: No results found.   Medications:   . dialysis solution 1.5% low-MG/low-CA    . remdesivir 100 mg in NS 100 mL 100 mg (10/24/20 0858)   . aspirin  81 mg Oral Daily  . atorvastatin  80 mg Oral Daily  . calcitRIOL  0.25 mcg Oral Daily  . calcium carbonate  1 tablet Oral BID WC  . carvedilol  25 mg Oral BID  . dexamethasone  6 mg Oral Daily  . gentamicin cream  1 application Topical Daily  . heparin  5,000 Units Subcutaneous Q8H  . insulin aspart  0-15 Units Subcutaneous TID WC  . insulin aspart  0-5 Units Subcutaneous QHS  . Ipratropium-Albuterol  1 puff Inhalation QID  . mouth rinse  15 mL Mouth Rinse BID  . torsemide  100 mg Oral Daily     Assessment/ Plan:  [name] is a 64 y.o.  male  has h/o COPD,CAD and ESRD on peritoneal dialysis.He  came to ED with SOB, diagnosed with COVID 19 infection.   # ESRD on peritoneal dialysis  Patient received his peritoneal dialysis treatment last night No issues with treatment,tolerated well. Will plan for PD again tonight Elevated BUN due to Steroids  #Secondary hyperparathyroidism Lab Results  Component Value Date   CALCIUM 7.2 (L) 10/24/2020    Continue Calcitriol and Calcium Carbonate Will monitor bone mineral metabolism parameters  #Hypertension BP control acceptable Patient is on Carvedilol and Torsemide  # Covid 19 pneumonia Patient is on Remdesivir and Decadron Respiratory status improving, still on 6L of O2 via nasal cannula.   LOS: 2 Deamber Buckhalter 11/12/20212:32 PM

## 2020-10-24 NOTE — Progress Notes (Signed)
PROGRESS NOTE    Willie Hahn  TFT:[id number] DOB: 04-04-1956 DOA: 10/29/2020 PCP: Birdie Sons, MD    Assessment & Plan:   Principal Problem:   Acute hypoxemic respiratory failure due to COVID-19 [facility]) Active Problems:   Coronary artery disease   Essential hypertension   Insomnia   Compulsive tobacco user syndrome   H/O placement of stent in anterior descending branch of left coronary artery   Mixed hyperlipidemia   CKD (chronic kidney disease) stage V requiring chronic dialysis ([place])   Focal segmental glomerulosclerosis   Pneumonia due to COVID-19 virus    Willie Hahn is a 64 y.o. male with medical history significant for hypertension, hyperlipidemia, gout in remission, end-stage renal disease on peritoneal dialysis presented to the emergency department for chief concerns of shortness of breath x6 days worsening with minimal improvement with home inhaler for his COPD.   Acute hypoxic respiratory failure secondary to COVID-19 PNA -Status post Solu-Medrol 125 mg IV per ED provider --O2 requirement up to 6L currently --CRP 7.7, trending down PLAN: --cont Remdesivir --cont steroid as oral decadron --cont combivent -Continue with oxygen therapy to maintain SPO2 greater than 92%  CAD -appears stable at this time no clinical indication for ACS --on home atorvastatin 80 mg daily, Coreg 25 mg twice daily, Imdur 30 mg daily, aspirin 81 mg daily PLAN: --cont home coreg --cont ASA and statin  COPD  -Tiotropium nebs at home is held -cont Combivent as QID -Albuterol PRN q4h for shortness of breath and wheezing  Hypertension  --BP low normal -On home amlodipine 10 mg daily, carvedilol 25 mg bid, hydralazine 50 mg TID, losartan-hctz 100-12.5 daily at home, torsemide PLAN: --cont home coreg and torsemide --Hold the rest due to low normal BP  ESRD on peritoneal dialysis Secondary hyperparathyroidism -Status post 500 cc LR bolus in the ED -Patient endorses  compliance with peritoneal dialysis -Nephrology has been consulted -Patient is still urinating PLAN: --cont home torsemide 100 mg daily --cont inpatient peritoneal dialysis, per nephrology --cont Calcitriol and Ca Carbonate  Hyperlipidemia -cont atorvastatin 80 mg daily  Tobacco user -patient not ready to quit  Hyperglycemia, likely due to steroid use Prediabetic --A1c 6.1 --SSI   DVT prophylaxis: Heparin SQ Code Status: Limited code  Family Communication:  Status is: inpatient Dispo:   The patient is from: home Anticipated d/c is to: home Anticipated d/c date is: >3 days Patient currently is not medically stable to d/c due to: COVID PNA, 5L O2.   Subjective and Interval History:  Pt had no complaints, just wanted to go home.     Objective: Vitals:   10/24/20 0500 10/24/20 0624 10/24/20 0822 10/24/20 1217  BP:  (!) 145/80 130/73 129/75  Pulse:  68 67 70  Resp:  20  20  Temp:  97.7 F (36.5 C) 97.6 F (36.4 C) (!) 97.4 F (36.3 C)  TempSrc:   Oral Oral  SpO2:  91% 93% 90%  Weight: 78.2 kg     Height:        Intake/Output Summary (Last 24 hours) at 10/24/2020 1403 Last data filed at 10/24/2020 0600 Gross per 24 hour  Intake 8100.06 ml  Output --  Net 8100.06 ml   Filed Weights   10/31/2020 2105 10/23/20 0500 10/24/20 0500  Weight: 78 kg 78.2 kg 78.2 kg    Examination:  Constitutional: NAD, AAOx3 HEENT: conjunctivae and lids normal, EOMI CV: No cyanosis.   RESP: normal respiratory effort, on 5L Extremities: No effusions, edema  in BLE SKIN: warm, dry and intact Neuro: II - XII grossly intact.     Data Reviewed: I have personally reviewed following labs and imaging studies  CBC: Recent Labs  Lab 11/07/2020 1213 10/23/20 0549 10/24/20 0522  WBC 5.9 18.8* 17.9*  NEUTROABS 4.4  --   --   HGB 13.9 12.8* 12.7*  HCT 40.6 36.9* 35.9*  MCV 93.8 91.3 90.9  PLT 107* 139* 193*   Basic Metabolic Panel: Recent Labs  Lab 10/25/2020 1213  10/23/20 0549 10/24/20 0522  NA 136 133* 134*  K 4.8 4.5 4.4  CL 95* 92* 94*  CO2 23 19* 19*  GLUCOSE 126* 229* 229*  BUN 96* 130* 152*  CREATININE 10.53* 10.98* 10.93*  CALCIUM 6.8* 7.2* 7.2*  MG  --  1.9 1.9   GFR: Estimated Creatinine Clearance: 6.9 mL/min (A) (by C-G formula based on SCr of 10.93 mg/dL (H)). Liver Function Tests: Recent Labs  Lab 10/22/20 1431  AST 52*  ALT 21  ALKPHOS 55  BILITOT 0.7  PROT 5.8*  ALBUMIN 2.7*   No results for input(s): LIPASE, AMYLASE in the last 168 hours. No results for input(s): AMMONIA in the last 168 hours. Coagulation Profile: No results for input(s): INR, PROTIME in the last 168 hours. Cardiac Enzymes: No results for input(s): CKTOTAL, CKMB, CKMBINDEX, TROPONINI in the last 168 hours. BNP (last 3 results) No results for input(s): PROBNP in the last 8760 hours. HbA1C: Recent Labs    10/23/20 0549  HGBA1C 6.1*   CBG: Recent Labs  Lab 10/24/20 1305  GLUCAP 182*   Lipid Profile: No results for input(s): CHOL, HDL, LDLCALC, TRIG, CHOLHDL, LDLDIRECT in the last 72 hours. Thyroid Function Tests: No results for input(s): TSH, T4TOTAL, FREET4, T3FREE, THYROIDAB in the last 72 hours. Anemia Panel: No results for input(s): VITAMINB12, FOLATE, FERRITIN, TIBC, IRON, RETICCTPCT in the last 72 hours. Sepsis Labs: No results for input(s): PROCALCITON, LATICACIDVEN in the last 168 hours.  Recent Results (from the past 240 hour(s))  Respiratory Panel by RT PCR (Flu A&B, Covid) - Nasopharyngeal Swab     Status: Abnormal   Collection Time: 11/08/2020  1:16 PM   Specimen: Nasopharyngeal Swab  Result Value Ref Range Status   SARS Coronavirus 2 by RT PCR POSITIVE (A) NEGATIVE Final    Comment: RESULT CALLED TO, READ BACK BY AND VERIFIED WITH: REID RENO 10/15/2020 1433 KLW (NOTE) SARS-CoV-2 target nucleic acids are DETECTED.  SARS-CoV-2 RNA is generally detectable in upper respiratory specimens  during the acute phase of infection.  Positive results are indicative of the presence of the identified virus, but do not rule out bacterial infection or co-infection with other pathogens not detected by the test. Clinical correlation with patient history and other diagnostic information is necessary to determine patient infection status. The expected result is Negative.  Fact Sheet for Patients:  PinkCheek.be  Fact Sheet for Healthcare Providers: GravelBags.it  This test is not yet approved or cleared by the [place] FDA and  has been authorized for detection and/or diagnosis of SARS-CoV-2 by FDA under an Emergency Use Authorization (EUA).  This EUA will remain in effect (meaning this test can be used) fo r the duration of  the COVID-19 declaration under Section 564(b)(1) of the Act, 21 U.S.C. section 360bbb-3(b)(1), unless the authorization is terminated or revoked sooner.      Influenza A by PCR NEGATIVE NEGATIVE Final   Influenza B by PCR NEGATIVE NEGATIVE Final    Comment: (NOTE) The  Xpert Xpress SARS-CoV-2/FLU/RSV assay is intended as an aid in  the diagnosis of influenza from Nasopharyngeal swab specimens and  should not be used as a sole basis for treatment. Nasal washings and  aspirates are unacceptable for Xpert Xpress SARS-CoV-2/FLU/RSV  testing.  Fact Sheet for Patients: PinkCheek.be  Fact Sheet for Healthcare Providers: GravelBags.it  This test is not yet approved or cleared by the [place] FDA and  has been authorized for detection and/or diagnosis of SARS-CoV-2 by  FDA under an Emergency Use Authorization (EUA). This EUA will remain  in effect (meaning this test can be used) for the duration of the  Covid-19 declaration under Section 564(b)(1) of the Act, 21  U.S.C. section 360bbb-3(b)(1), unless the authorization is  terminated or revoked. Performed at [facility], [address], [place], [place] [address]       Radiology Studies: No results found.   Scheduled Meds: . aspirin  81 mg Oral Daily  . atorvastatin  80 mg Oral Daily  . calcitRIOL  0.25 mcg Oral Daily  . calcium carbonate  1 tablet Oral BID WC  . carvedilol  25 mg Oral BID  . dexamethasone  6 mg Oral Daily  . gentamicin cream  1 application Topical Daily  . heparin  5,000 Units Subcutaneous Q8H  . insulin aspart  0-15 Units Subcutaneous TID WC  . insulin aspart  0-5 Units Subcutaneous QHS  . Ipratropium-Albuterol  1 puff Inhalation QID  . mouth rinse  15 mL Mouth Rinse BID  . torsemide  100 mg Oral Daily   Continuous Infusions: . dialysis solution 1.5% low-MG/low-CA    . remdesivir 100 mg in NS 100 mL 100 mg (10/24/20 0858)     LOS: 2 days     Enzo Bi, MD [facility] If 7PM-7AM, please contact night-coverage 10/24/2020, 2:03 PM

## 2020-10-24 NOTE — Plan of Care (Signed)
  Problem: Education: Goal: Knowledge of risk factors and measures for prevention of condition will improve Outcome: Progressing   Problem: Coping: Goal: Psychosocial and spiritual needs will be supported Outcome: Progressing   Problem: Respiratory: Goal: Will maintain a patent airway Outcome: Progressing Goal: Complications related to the disease process, condition or treatment will be avoided or minimized Outcome: Progressing   Problem: Education: Goal: Knowledge of General Education information will improve Description: Including pain rating scale, medication(s)/side effects and non-pharmacologic comfort measures Outcome: Progressing   Problem: Clinical Measurements: Goal: Ability to maintain clinical measurements within normal limits will improve Outcome: Progressing Goal: Will remain free from infection Outcome: Progressing Goal: Diagnostic test results will improve Outcome: Progressing Goal: Respiratory complications will improve Outcome: Progressing Goal: Cardiovascular complication will be avoided Outcome: Progressing   Problem: Elimination: Goal: Will not experience complications related to bowel motility Outcome: Progressing Goal: Will not experience complications related to urinary retention Outcome: Progressing   Problem: Pain Managment: Goal: General experience of comfort will improve Outcome: Progressing   Problem: Safety: Goal: Ability to remain free from injury will improve Outcome: Progressing   Problem: Skin Integrity: Goal: Risk for impaired skin integrity will decrease Outcome: Progressing

## 2020-10-25 ENCOUNTER — Inpatient Hospital Stay: Payer: Managed Care, Other (non HMO)

## 2020-10-25 DIAGNOSIS — J9601 Acute respiratory failure with hypoxia: Secondary | ICD-10-CM | POA: Diagnosis not present

## 2020-10-25 DIAGNOSIS — U071 COVID-19: Secondary | ICD-10-CM | POA: Diagnosis not present

## 2020-10-25 LAB — BASIC METABOLIC PANEL
Anion gap: 23 — ABNORMAL HIGH (ref 5–15)
BUN: 187 mg/dL — ABNORMAL HIGH (ref 8–23)
CO2: 16 mmol/L — ABNORMAL LOW (ref 22–32)
Calcium: 7.2 mg/dL — ABNORMAL LOW (ref 8.9–10.3)
Chloride: 96 mmol/L — ABNORMAL LOW (ref 98–111)
Creatinine, Ser: 11.41 mg/dL — ABNORMAL HIGH (ref 0.61–1.24)
GFR, Estimated: 5 mL/min — ABNORMAL LOW (ref >60)
Glucose, Bld: 197 mg/dL — ABNORMAL HIGH (ref 70–99)
Potassium: 4.9 mmol/L (ref 3.5–5.1)
Sodium: 135 mmol/L (ref 135–145)

## 2020-10-25 LAB — GLUCOSE, CAPILLARY
Glucose-Capillary: 200 mg/dL — ABNORMAL HIGH (ref 70–99)
Glucose-Capillary: 193 mg/dL — ABNORMAL HIGH (ref 70–99)
Glucose-Capillary: 158 mg/dL — ABNORMAL HIGH (ref 70–99)
Glucose-Capillary: 172 mg/dL — ABNORMAL HIGH (ref 70–99)

## 2020-10-25 LAB — CBC
HCT: 37.1 % — ABNORMAL LOW (ref 39.0–52.0)
Hemoglobin: 13.5 g/dL (ref 13.0–17.0)
MCH: 32.3 pg (ref 26.0–34.0)
MCHC: 36.4 g/dL — ABNORMAL HIGH (ref 30.0–36.0)
MCV: 88.8 fL (ref 80.0–100.0)
Platelets: 151 10*3/uL (ref 150–400)
RBC: 4.18 MIL/uL — ABNORMAL LOW (ref 4.22–5.81)
RDW: 12.7 % (ref 11.5–15.5)
WBC: 19.1 10*3/uL — ABNORMAL HIGH (ref 4.0–10.5)
nRBC: 0 % (ref 0.0–0.2)

## 2020-10-25 LAB — MAGNESIUM: Magnesium: 1.9 mg/dL (ref 1.7–2.4)

## 2020-10-25 MED ORDER — SODIUM CHLORIDE 0.9 % IV SOLN
INTRAVENOUS | Status: DC | PRN
  Administered 2020-10-25 – 2020-10-26 (×2): 500 mL via INTRAVENOUS

## 2020-10-25 MED ORDER — INSULIN DETEMIR 100 UNIT/ML ~~LOC~~ SOLN
5.0000 [IU] | Freq: Two times a day (BID) | SUBCUTANEOUS | Status: DC
  Administered 2020-10-25 – 2020-10-27 (×5): 5 [IU] via SUBCUTANEOUS
  Filled 2020-10-25 (×7): qty 0.05

## 2020-10-25 NOTE — Progress Notes (Signed)
Dialysis nurse came to remove to disconnect patient this Morning and found Pt had not run. Stated the Machine was beeping and  No one came to help him and so he took himself off. Pt stated he had to go to the bathroom and he  Didn't think the lines were long enough . He did not clamp. He he went to put himself back on the machin would not run.. Pt  restarted at 0900  And requested the nurse call with any issues

## 2020-10-25 NOTE — Progress Notes (Signed)
   10/25/20 2247  Assess: MEWS Score  Temp (!) 97.5 F (36.4 C)  BP (!) 151/70  Pulse Rate 78  Resp (!) 30  SpO2 90 %  Assess: MEWS Score  MEWS Temp 0  MEWS Systolic 0  MEWS Pulse 0  MEWS RR 2  MEWS LOC 0  MEWS Score 2  MEWS Score Color Yellow  Assess: if the MEWS score is Yellow or Red  Were vital signs taken at a resting state? Yes  Focused Assessment Change from prior assessment (see assessment flowsheet)  Early Detection of Sepsis Score *See Row Information* Low  MEWS guidelines implemented *See Row Information* Yes  Take Vital Signs  Increase Vital Sign Frequency  Yellow: Q 2hr X 2 then Q 4hr X 2, if remains yellow, continue Q 4hrs  Escalate  MEWS: Escalate Yellow: discuss with charge nurse/RN and consider discussing with provider and RRT  Notify: Charge Nurse/RN  Name of Charge Nurse/RN Notified  (I'm charge)  Date Charge Nurse/RN Notified 10/25/20  Time Charge Nurse/RN Notified 2215  Notify: Provider  Provider Name/Title Rufina Falco, NP  Date Provider Notified 10/25/20  Time Provider Notified 2221  Notification Type Page  Response See new orders;Other (Comment) (she also came up to evaluate pt)  Date of Provider Response 10/25/20  Time of Provider Response 2225  Document  Progress note created (see row info) Yes

## 2020-10-25 NOTE — Plan of Care (Signed)
Concerned about this patient. He is tachypnic, although O2 sats are 88-92. He seems to be in terrible pain, but can't articulate what is going on. Just sitting on side of bed hunched over bedside table in obvious distress. He's gray in color. He apparently had peritoneal dialysis last night and again today. The nurse tech and nurse from last night say this is a significant change. Have sent msg to Dr. Holley Raring and Rufina Falco, NP.

## 2020-10-25 NOTE — Progress Notes (Signed)
1645 PT removed from PD.  Pt clamped and  Line secure. Will  Be dialyzed Again Sunday 11/14 night.

## 2020-10-25 NOTE — Progress Notes (Signed)
Patient conducting peritoneal dialysis during the daytime today.  Did not have PD overnight.  Continue to monitor progress.

## 2020-10-25 NOTE — Progress Notes (Signed)
PROGRESS NOTE    Willie Hahn  LZJ:[id number] DOB: 1956/06/17 DOA: 10/27/2020 PCP: Birdie Sons, MD    Assessment & Plan:   Principal Problem:   Acute hypoxemic respiratory failure due to COVID-19 [facility]) Active Problems:   Coronary artery disease   Essential hypertension   Insomnia   Compulsive tobacco user syndrome   H/O placement of stent in anterior descending branch of left coronary artery   Mixed hyperlipidemia   CKD (chronic kidney disease) stage V requiring chronic dialysis ([place])   Focal segmental glomerulosclerosis   Pneumonia due to COVID-19 virus    Willie Hahn is a 64 y.o. male with medical history significant for hypertension, hyperlipidemia, gout in remission, end-stage renal disease on peritoneal dialysis presented to the emergency department for chief concerns of shortness of breath x6 days worsening with minimal improvement with home inhaler for his COPD.   Acute hypoxic respiratory failure secondary to COVID-19 PNA -Status post Solu-Medrol 125 mg IV per ED provider --O2 requirement up to 6L currently --CRP 7.7, trending down PLAN: --cont Remdesivir --cont steroid as oral decadron --cont Combivent QID -Continue with oxygen therapy to maintain SPO2 greater than 92%  CAD -appears stable at this time no clinical indication for ACS --on home atorvastatin 80 mg daily, Coreg 25 mg twice daily, Imdur 30 mg daily, aspirin 81 mg daily PLAN: --cont home coreg --Hold Imdur --cont ASA and statin  COPD  --stable, no wheezing PLAN: -cont Combivent as QID -Albuterol PRN q4h for shortness of breath and wheezing  Hypertension  --BP varied -On home amlodipine 10 mg daily, carvedilol 25 mg bid, hydralazine 50 mg TID, losartan-hctz 100-12.5 daily at home, torsemide PLAN: --cont home coreg and torsemide --Hold the rest due to sometimes low BP  ESRD on peritoneal dialysis Secondary hyperparathyroidism -Status post 500 cc LR bolus in the ED -Patient  endorses compliance with peritoneal dialysis -Nephrology has been consulted -Patient is still urinating PLAN: --cont home torsemide 100 mg daily --cont inpatient peritoneal dialysis, per nephrology --cont Calcitriol and Ca Carbonate  Hyperlipidemia -cont atorvastatin 80 mg daily  Tobacco user -patient not ready to quit  Hyperglycemia, likely due to steroid use Prediabetic --A1c 6.1 --start Levemir 5u BID --SSI   DVT prophylaxis: Heparin SQ Code Status: Limited code  Family Communication:  Status is: inpatient Dispo:   The patient is from: home Anticipated d/c is to: home Anticipated d/c date is: >3 days Patient currently is not medically stable to d/c due to: COVID PNA, 6L O2.   Subjective and Interval History:  PD did not go well overnight.  Pt seemed frustrated with the PD machine beeping loudly and refused to answer any questions this morning.  Still on 6L O2.   Objective: Vitals:   10/25/20 0500 10/25/20 0520 10/25/20 0856 10/25/20 1100  BP:  131/65 (!) 126/57 113/70  Pulse:  74 72   Resp:  20 (!) 23 (!) 22  Temp:  97.7 F (36.5 C) 97.7 F (36.5 C) 97.7 F (36.5 C)  TempSrc:  Oral Axillary   SpO2:   93% 90%  Weight: 77.2 kg     Height:        Intake/Output Summary (Last 24 hours) at 10/25/2020 1307 Last data filed at 10/25/2020 0653 Gross per 24 hour  Intake 120 ml  Output 500 ml  Net -380 ml   Filed Weights   10/23/20 0500 10/24/20 0500 10/25/20 0500  Weight: 78.2 kg 78.2 kg 77.2 kg    Examination:  Constitutional: NAD, alert, standing next to the bed HEENT: conjunctivae and lids normal, EOMI CV: No cyanosis.   RESP: on 6L Extremities: No effusions, edema in BLE SKIN: warm, dry and intact Neuro: II - XII grossly intact.     Data Reviewed: I have personally reviewed following labs and imaging studies  CBC: Recent Labs  Lab 11/06/2020 1213 10/23/20 0549 10/24/20 0522 10/25/20 0542  WBC 5.9 18.8* 17.9* 19.1*  NEUTROABS 4.4  --    --   --   HGB 13.9 12.8* 12.7* 13.5  HCT 40.6 36.9* 35.9* 37.1*  MCV 93.8 91.3 90.9 88.8  PLT 107* 139* 127* 174   Basic Metabolic Panel: Recent Labs  Lab 10/29/2020 1213 10/23/20 0549 10/24/20 0522 10/25/20 0542  NA 136 133* 134* 135  K 4.8 4.5 4.4 4.9  CL 95* 92* 94* 96*  CO2 23 19* 19* 16*  GLUCOSE 126* 229* 229* 197*  BUN 96* 130* 152* 187*  CREATININE 10.53* 10.98* 10.93* 11.41*  CALCIUM 6.8* 7.2* 7.2* 7.2*  MG  --  1.9 1.9 1.9   GFR: Estimated Creatinine Clearance: 6.6 mL/min (A) (by C-G formula based on SCr of 11.41 mg/dL (H)). Liver Function Tests: Recent Labs  Lab 10/22/20 1431  AST 52*  ALT 21  ALKPHOS 55  BILITOT 0.7  PROT 5.8*  ALBUMIN 2.7*   No results for input(s): LIPASE, AMYLASE in the last 168 hours. No results for input(s): AMMONIA in the last 168 hours. Coagulation Profile: No results for input(s): INR, PROTIME in the last 168 hours. Cardiac Enzymes: No results for input(s): CKTOTAL, CKMB, CKMBINDEX, TROPONINI in the last 168 hours. BNP (last 3 results) No results for input(s): PROBNP in the last 8760 hours. HbA1C: Recent Labs    10/23/20 0549  HGBA1C 6.1*   CBG: Recent Labs  Lab 10/24/20 1305 10/24/20 1633 10/24/20 1951 10/25/20 0755 10/25/20 1143  GLUCAP 182* 208* 159* 200* 193*   Lipid Profile: No results for input(s): CHOL, HDL, LDLCALC, TRIG, CHOLHDL, LDLDIRECT in the last 72 hours. Thyroid Function Tests: No results for input(s): TSH, T4TOTAL, FREET4, T3FREE, THYROIDAB in the last 72 hours. Anemia Panel: No results for input(s): VITAMINB12, FOLATE, FERRITIN, TIBC, IRON, RETICCTPCT in the last 72 hours. Sepsis Labs: No results for input(s): PROCALCITON, LATICACIDVEN in the last 168 hours.  Recent Results (from the past 240 hour(s))  Respiratory Panel by RT PCR (Flu A&B, Covid) - Nasopharyngeal Swab     Status: Abnormal   Collection Time: 11/09/2020  1:16 PM   Specimen: Nasopharyngeal Swab  Result Value Ref Range Status    SARS Coronavirus 2 by RT PCR POSITIVE (A) NEGATIVE Final    Comment: RESULT CALLED TO, READ BACK BY AND VERIFIED WITH: REID RENO 10/13/2020 1433 KLW (NOTE) SARS-CoV-2 target nucleic acids are DETECTED.  SARS-CoV-2 RNA is generally detectable in upper respiratory specimens  during the acute phase of infection. Positive results are indicative of the presence of the identified virus, but do not rule out bacterial infection or co-infection with other pathogens not detected by the test. Clinical correlation with patient history and other diagnostic information is necessary to determine patient infection status. The expected result is Negative.  Fact Sheet for Patients:  PinkCheek.be  Fact Sheet for Healthcare Providers: GravelBags.it  This test is not yet approved or cleared by the [place] FDA and  has been authorized for detection and/or diagnosis of SARS-CoV-2 by FDA under an Emergency Use Authorization (EUA).  This EUA will remain in effect (meaning  this test can be used) fo r the duration of  the COVID-19 declaration under Section 564(b)(1) of the Act, 21 U.S.C. section 360bbb-3(b)(1), unless the authorization is terminated or revoked sooner.      Influenza A by PCR NEGATIVE NEGATIVE Final   Influenza B by PCR NEGATIVE NEGATIVE Final    Comment: (NOTE) The Xpert Xpress SARS-CoV-2/FLU/RSV assay is intended as an aid in  the diagnosis of influenza from Nasopharyngeal swab specimens and  should not be used as a sole basis for treatment. Nasal washings and  aspirates are unacceptable for Xpert Xpress SARS-CoV-2/FLU/RSV  testing.  Fact Sheet for Patients: PinkCheek.be  Fact Sheet for Healthcare Providers: GravelBags.it  This test is not yet approved or cleared by the [place] FDA and  has been authorized for detection and/or diagnosis of SARS-CoV-2 by  FDA  under an Emergency Use Authorization (EUA). This EUA will remain  in effect (meaning this test can be used) for the duration of the  Covid-19 declaration under Section 564(b)(1) of the Act, 21  U.S.C. section 360bbb-3(b)(1), unless the authorization is  terminated or revoked. Performed at [facility], [address] [place], [place] [address]       Radiology Studies: No results found.   Scheduled Meds: . aspirin  81 mg Oral Daily  . atorvastatin  80 mg Oral Daily  . calcitRIOL  0.25 mcg Oral Daily  . calcium carbonate  1 tablet Oral BID WC  . carvedilol  25 mg Oral BID  . dexamethasone  6 mg Oral Daily  . gentamicin cream  1 application Topical Daily  . heparin  5,000 Units Subcutaneous Q8H  . insulin aspart  0-15 Units Subcutaneous TID WC  . insulin aspart  0-5 Units Subcutaneous QHS  . Ipratropium-Albuterol  1 puff Inhalation QID  . mouth rinse  15 mL Mouth Rinse BID  . torsemide  100 mg Oral Daily   Continuous Infusions: . sodium chloride 500 mL (10/25/20 0913)  . dialysis solution 1.5% low-MG/low-CA    . remdesivir 100 mg in NS 100 mL 100 mg (10/25/20 0914)     LOS: 3 days     Enzo Bi, MD [facility] If 7PM-7AM, please contact night-coverage 10/25/2020, 1:07 PM

## 2020-10-26 DIAGNOSIS — U071 COVID-19: Secondary | ICD-10-CM | POA: Diagnosis not present

## 2020-10-26 DIAGNOSIS — J9601 Acute respiratory failure with hypoxia: Secondary | ICD-10-CM | POA: Diagnosis not present

## 2020-10-26 LAB — C-REACTIVE PROTEIN: CRP: 5.4 mg/dL — ABNORMAL HIGH (ref <1.0)

## 2020-10-26 LAB — LACTIC ACID, PLASMA: Lactic Acid, Venous: 1.2 mmol/L (ref 0.5–1.9)

## 2020-10-26 LAB — BASIC METABOLIC PANEL
Anion gap: 25 — ABNORMAL HIGH (ref 5–15)
Anion gap: 26 — ABNORMAL HIGH (ref 5–15)
BUN: 235 mg/dL — ABNORMAL HIGH (ref 8–23)
BUN: 242 mg/dL — ABNORMAL HIGH (ref 8–23)
CO2: 16 mmol/L — ABNORMAL LOW (ref 22–32)
CO2: 15 mmol/L — ABNORMAL LOW (ref 22–32)
Calcium: 7.3 mg/dL — ABNORMAL LOW (ref 8.9–10.3)
Calcium: 7.3 mg/dL — ABNORMAL LOW (ref 8.9–10.3)
Chloride: 94 mmol/L — ABNORMAL LOW (ref 98–111)
Chloride: 93 mmol/L — ABNORMAL LOW (ref 98–111)
Creatinine, Ser: 12 mg/dL — ABNORMAL HIGH (ref 0.61–1.24)
Creatinine, Ser: 12.15 mg/dL — ABNORMAL HIGH (ref 0.61–1.24)
GFR, Estimated: 4 mL/min — ABNORMAL LOW (ref >60)
GFR, Estimated: 4 mL/min — ABNORMAL LOW (ref >60)
Glucose, Bld: 210 mg/dL — ABNORMAL HIGH (ref 70–99)
Glucose, Bld: 228 mg/dL — ABNORMAL HIGH (ref 70–99)
Potassium: 5.6 mmol/L — ABNORMAL HIGH (ref 3.5–5.1)
Potassium: 5.7 mmol/L — ABNORMAL HIGH (ref 3.5–5.1)
Sodium: 135 mmol/L (ref 135–145)
Sodium: 134 mmol/L — ABNORMAL LOW (ref 135–145)

## 2020-10-26 LAB — CBC
HCT: 33.8 % — ABNORMAL LOW (ref 39.0–52.0)
HCT: 36.1 % — ABNORMAL LOW (ref 39.0–52.0)
Hemoglobin: 12.2 g/dL — ABNORMAL LOW (ref 13.0–17.0)
Hemoglobin: 13 g/dL (ref 13.0–17.0)
MCH: 31.7 pg (ref 26.0–34.0)
MCH: 32 pg (ref 26.0–34.0)
MCHC: 36 g/dL (ref 30.0–36.0)
MCHC: 36.1 g/dL — ABNORMAL HIGH (ref 30.0–36.0)
MCV: 88 fL (ref 80.0–100.0)
MCV: 88.7 fL (ref 80.0–100.0)
Platelets: 139 10*3/uL — ABNORMAL LOW (ref 150–400)
Platelets: 148 10*3/uL — ABNORMAL LOW (ref 150–400)
RBC: 3.81 MIL/uL — ABNORMAL LOW (ref 4.22–5.81)
RBC: 4.1 MIL/uL — ABNORMAL LOW (ref 4.22–5.81)
RDW: 12.6 % (ref 11.5–15.5)
RDW: 12.8 % (ref 11.5–15.5)
WBC: 18.4 10*3/uL — ABNORMAL HIGH (ref 4.0–10.5)
WBC: 20.3 10*3/uL — ABNORMAL HIGH (ref 4.0–10.5)
nRBC: 0 % (ref 0.0–0.2)
nRBC: 0 % (ref 0.0–0.2)

## 2020-10-26 LAB — BLOOD GAS, ARTERIAL
Acid-Base Excess: 8.4 mmol/L — ABNORMAL HIGH (ref 0.0–2.0)
Bicarbonate: 16.3 mmol/L — ABNORMAL LOW (ref 20.0–28.0)
FIO2: 0.36
O2 Saturation: 63.9 %
pCO2 arterial: 31 mmHg — ABNORMAL LOW (ref 32.0–48.0)
pH, Arterial: 7.33 — ABNORMAL LOW (ref 7.350–7.450)
pO2, Arterial: 36 mmHg — CL (ref 83.0–108.0)

## 2020-10-26 LAB — FIBRIN DERIVATIVES D-DIMER (ARMC ONLY): Fibrin derivatives D-dimer (ARMC): 1442.12 ng/mL (FEU) — ABNORMAL HIGH (ref 0.00–499.00)

## 2020-10-26 LAB — PROCALCITONIN
Procalcitonin: 2.33 ng/mL
Procalcitonin: 2.28 ng/mL

## 2020-10-26 LAB — GLUCOSE, CAPILLARY
Glucose-Capillary: 199 mg/dL — ABNORMAL HIGH (ref 70–99)
Glucose-Capillary: 200 mg/dL — ABNORMAL HIGH (ref 70–99)
Glucose-Capillary: 137 mg/dL — ABNORMAL HIGH (ref 70–99)
Glucose-Capillary: 173 mg/dL — ABNORMAL HIGH (ref 70–99)

## 2020-10-26 LAB — MAGNESIUM: Magnesium: 2.2 mg/dL (ref 1.7–2.4)

## 2020-10-26 MED ORDER — SODIUM CHLORIDE 0.9 % IV SOLN
2.0000 g | INTRAVENOUS | Status: AC
  Administered 2020-10-26 – 2020-10-30 (×5): 2 g via INTRAVENOUS
  Filled 2020-10-26: qty 2
  Filled 2020-10-26 (×3): qty 20
  Filled 2020-10-26: qty 2

## 2020-10-26 MED ORDER — SODIUM CHLORIDE 0.9 % IV SOLN
500.0000 mg | INTRAVENOUS | Status: AC
  Administered 2020-10-26 – 2020-10-30 (×5): 500 mg via INTRAVENOUS
  Filled 2020-10-26 (×5): qty 500

## 2020-10-26 MED ORDER — BARICITINIB 2 MG PO TABS
2.0000 mg | ORAL_TABLET | Freq: Every day | ORAL | Status: DC
  Administered 2020-10-26 – 2020-10-27 (×2): 2 mg via ORAL
  Filled 2020-10-26 (×2): qty 1

## 2020-10-26 NOTE — Progress Notes (Signed)
PROGRESS NOTE    Willie Hahn  YPP:[id number] DOB: 12/04/1956 DOA: 10/25/2020 PCP: Birdie Sons, MD    Assessment & Plan:   Principal Problem:   Acute hypoxemic respiratory failure due to COVID-19 [facility]) Active Problems:   Coronary artery disease   Essential hypertension   Insomnia   Compulsive tobacco user syndrome   H/O placement of stent in anterior descending branch of left coronary artery   Mixed hyperlipidemia   CKD (chronic kidney disease) stage V requiring chronic dialysis ([place])   Focal segmental glomerulosclerosis   Pneumonia due to COVID-19 virus    Willie Hahn is a 64 y.o. male with medical history significant for hypertension, hyperlipidemia, gout in remission, end-stage renal disease on peritoneal dialysis presented to the emergency department for chief concerns of shortness of breath x6 days worsening with minimal improvement with home inhaler for his COPD.   Acute hypoxic respiratory failure secondary to COVID-19 PNA, worsening -Status post Solu-Medrol 125 mg IV per ED provider --O2 requirement was 6L, had acute worsening of hypoxic respiratory failure overnight 11/13, had to be put on heated hf. --CRP 7.7, trending down --procal 2.33, empiric ceftriaxone/azithromycin started. PLAN: --cont Remdesivir --cont steroid as oral decadron --cont Combivent QID --cont ceftriaxone and azithromycin --start baricitinib --Continue supplemental O2 to keep sats between 88-92%, wean as tolerated --transfer to progressive unit  CAD -appears stable at this time no clinical indication for ACS --on home atorvastatin 80 mg daily, Coreg 25 mg twice daily, Imdur 30 mg daily, aspirin 81 mg daily PLAN: --cont home coreg --Hold Imdur --cont ASA and statin  COPD  --stable, no wheezing PLAN: -cont Combivent as QID -Albuterol PRN q4h for shortness of breath and wheezing  Hypertension  --BP varied -On home amlodipine 10 mg daily, carvedilol 25 mg bid, hydralazine 50  mg TID, losartan-hctz 100-12.5 daily at home, torsemide PLAN: --cont home coreg and torsemide --Hold rest of BP meds due to BP within range  ESRD on peritoneal dialysis Secondary hyperparathyroidism -Status post 500 cc LR bolus in the ED -Patient endorses compliance with peritoneal dialysis -Nephrology has been consulted -Patient is still urinating PLAN: --cont home torsemide 100 mg daily --cont inpatient PD per nephrology --cont Calcitriol and Ca Carbonate  Hyperlipidemia -cont atorvastatin 80 mg daily  Tobacco user -patient not ready to quit  Hyperglycemia, likely due to steroid use Prediabetic --A1c 6.1 PLAN: --cont Levemir 5u BID --SSI   DVT prophylaxis: Heparin SQ Code Status: Limited code No intubation, confirmed with daughter. Family Communication: daughter updated on the phone today Status is: inpatient Dispo:   The patient is from: home Anticipated d/c is to: home Anticipated d/c date is: >3 days Patient currently is not medically stable to d/c due to: on heated hf   Subjective and Interval History:  Pt had acute worsening of hypoxic respiratory failure overnight, had to be put on heated hf.  Also started on empiric abx.  Today, pt was awake, but didn't respond to any questions.     Objective: Vitals:   10/26/20 0914 10/26/20 1213 10/26/20 1216 10/26/20 1400  BP: 130/70 102/81  138/75  Pulse: 72 79  71  Resp: (!) 22 (!) 42 (!) 27   Temp: 98.1 F (36.7 C) (!) 97.4 F (36.3 C)  97.6 F (36.4 C)  TempSrc: Oral Oral    SpO2: 99% 90% 96% 100%  Weight:      Height:        Intake/Output Summary (Last 24 hours) at 10/26/2020 1459  Last data filed at 10/26/2020 1250 Gross per 24 hour  Intake 224.28 ml  Output 200 ml  Net 24.28 ml   Filed Weights   10/23/20 0500 10/24/20 0500 10/25/20 0500  Weight: 78.2 kg 78.2 kg 77.2 kg    Examination:  Constitutional: NAD, alert, but not responding to questions HEENT: conjunctivae and lids normal,  EOMI CV: No cyanosis.   RESP: Increased work of breathing, on heated hf Extremities: No effusions, edema in BLE SKIN: warm, dry and intact Neuro: II - XII grossly intact.     Data Reviewed: I have personally reviewed following labs and imaging studies  CBC: Recent Labs  Lab 11/02/2020 1213 11/02/2020 1213 10/23/20 0549 10/24/20 0522 10/25/20 0542 10/26/20 0005 10/26/20 0529  WBC 5.9   < > 18.8* 17.9* 19.1* 20.3* 18.4*  NEUTROABS 4.4  --   --   --   --   --   --   HGB 13.9   < > 12.8* 12.7* 13.5 13.0 12.2*  HCT 40.6   < > 36.9* 35.9* 37.1* 36.1* 33.8*  MCV 93.8   < > 91.3 90.9 88.8 88.0 88.7  PLT 107*   < > 139* 127* 151 148* 139*   < > = values in this interval not displayed.   Basic Metabolic Panel: Recent Labs  Lab 10/23/20 0549 10/24/20 0522 10/25/20 0542 10/26/20 0005 10/26/20 0529  NA 133* 134* 135 135 134*  K 4.5 4.4 4.9 5.6* 5.7*  CL 92* 94* 96* 94* 93*  CO2 19* 19* 16* 16* 15*  GLUCOSE 229* 229* 197* 210* 228*  BUN 130* 152* 187* 235* 242*  CREATININE 10.98* 10.93* 11.41* 12.00* 12.15*  CALCIUM 7.2* 7.2* 7.2* 7.3* 7.3*  MG 1.9 1.9 1.9  --  2.2   GFR: Estimated Creatinine Clearance: 6.2 mL/min (A) (by C-G formula based on SCr of 12.15 mg/dL (H)). Liver Function Tests: Recent Labs  Lab 10/22/20 1431  AST 52*  ALT 21  ALKPHOS 55  BILITOT 0.7  PROT 5.8*  ALBUMIN 2.7*   No results for input(s): LIPASE, AMYLASE in the last 168 hours. No results for input(s): AMMONIA in the last 168 hours. Coagulation Profile: No results for input(s): INR, PROTIME in the last 168 hours. Cardiac Enzymes: No results for input(s): CKTOTAL, CKMB, CKMBINDEX, TROPONINI in the last 168 hours. BNP (last 3 results) No results for input(s): PROBNP in the last 8760 hours. HbA1C: No results for input(s): HGBA1C in the last 72 hours. CBG: Recent Labs  Lab 10/25/20 1143 10/25/20 1644 10/25/20 2056 10/26/20 0910 10/26/20 1202  GLUCAP 193* 158* 172* 199* 200*   Lipid  Profile: No results for input(s): CHOL, HDL, LDLCALC, TRIG, CHOLHDL, LDLDIRECT in the last 72 hours. Thyroid Function Tests: No results for input(s): TSH, T4TOTAL, FREET4, T3FREE, THYROIDAB in the last 72 hours. Anemia Panel: No results for input(s): VITAMINB12, FOLATE, FERRITIN, TIBC, IRON, RETICCTPCT in the last 72 hours. Sepsis Labs: Recent Labs  Lab 10/26/20 0005 10/26/20 0529  PROCALCITON 2.33 2.28  LATICACIDVEN  --  1.2    Recent Results (from the past 240 hour(s))  Respiratory Panel by RT PCR (Flu A&B, Covid) - Nasopharyngeal Swab     Status: Abnormal   Collection Time: 10/20/2020  1:16 PM   Specimen: Nasopharyngeal Swab  Result Value Ref Range Status   SARS Coronavirus 2 by RT PCR POSITIVE (A) NEGATIVE Final    Comment: RESULT CALLED TO, READ BACK BY AND VERIFIED WITH: REID RENO 11/02/2020 1433 KLW (NOTE) SARS-CoV-2  target nucleic acids are DETECTED.  SARS-CoV-2 RNA is generally detectable in upper respiratory specimens  during the acute phase of infection. Positive results are indicative of the presence of the identified virus, but do not rule out bacterial infection or co-infection with other pathogens not detected by the test. Clinical correlation with patient history and other diagnostic information is necessary to determine patient infection status. The expected result is Negative.  Fact Sheet for Patients:  PinkCheek.be  Fact Sheet for Healthcare Providers: GravelBags.it  This test is not yet approved or cleared by the [place] FDA and  has been authorized for detection and/or diagnosis of SARS-CoV-2 by FDA under an Emergency Use Authorization (EUA).  This EUA will remain in effect (meaning this test can be used) fo r the duration of  the COVID-19 declaration under Section 564(b)(1) of the Act, 21 U.S.C. section 360bbb-3(b)(1), unless the authorization is terminated or revoked sooner.       Influenza A by PCR NEGATIVE NEGATIVE Final   Influenza B by PCR NEGATIVE NEGATIVE Final    Comment: (NOTE) The Xpert Xpress SARS-CoV-2/FLU/RSV assay is intended as an aid in  the diagnosis of influenza from Nasopharyngeal swab specimens and  should not be used as a sole basis for treatment. Nasal washings and  aspirates are unacceptable for Xpert Xpress SARS-CoV-2/FLU/RSV  testing.  Fact Sheet for Patients: PinkCheek.be  Fact Sheet for Healthcare Providers: GravelBags.it  This test is not yet approved or cleared by the [place] FDA and  has been authorized for detection and/or diagnosis of SARS-CoV-2 by  FDA under an Emergency Use Authorization (EUA). This EUA will remain  in effect (meaning this test can be used) for the duration of the  Covid-19 declaration under Section 564(b)(1) of the Act, 21  U.S.C. section 360bbb-3(b)(1), unless the authorization is  terminated or revoked. Performed at [facility], [address] [place],  [address]       Radiology Studies: DG Chest 1 View  Result Date: 10/26/2020 CLINICAL DATA:  Shortness of EXAM: CHEST  1 VIEW COMPARISON:  Breath 10/16/2020 FINDINGS: Worsening of bilateral mid lung opacities, likely indicating multifocal pneumonia. Normal pleural spaces and cardiomediastinal contours. IMPRESSION: Slight worsening of multifocal pneumonia. Electronically Signed   By: Ulyses Jarred M.D.   On: 10/26/2020 00:10     Scheduled Meds: . aspirin  81 mg Oral Daily  . atorvastatin  80 mg Oral Daily  . calcitRIOL  0.25 mcg Oral Daily  . calcium carbonate  1 tablet Oral BID WC  . carvedilol  25 mg Oral BID  . dexamethasone  6 mg Oral Daily  . gentamicin cream  1 application Topical Daily  . heparin  5,000 Units Subcutaneous Q8H  . insulin aspart  0-15 Units Subcutaneous TID WC  . insulin aspart  0-5 Units Subcutaneous QHS  . insulin detemir  5 Units Subcutaneous  BID  . Ipratropium-Albuterol  1 puff Inhalation QID  . mouth rinse  15 mL Mouth Rinse BID  . torsemide  100 mg Oral Daily   Continuous Infusions: . sodium chloride 500 mL (10/26/20 1115)  . azithromycin 500 mg (10/26/20 1116)  . cefTRIAXone (ROCEPHIN)  IV Stopped (10/26/20 0451)  . dialysis solution 1.5% low-MG/low-CA    . remdesivir 100 mg in NS 100 mL Stopped (10/25/20 0944)     LOS: 4 days     Enzo Bi, MD [facility] If 7PM-7AM, please contact night-coverage 10/26/2020, 2:59 PM

## 2020-10-26 NOTE — Progress Notes (Addendum)
This patient was in the process to be transferred to progressive care.  During the time of waiting for bed approval writer was called by NT that patients mews 3 due to RR 42 and that patient removed nasal cannula and he is up on the [facility].  This Probation officer went to patients' room.  RR 27 at recheck.  While patient on the bsc, bed was approved.  This Probation officer called RT and orderly to assist transfer the patient once he is back to the bed.     Attempted to call patients' daughter, left a message for [employer].

## 2020-10-26 NOTE — Progress Notes (Signed)
Will resume PD nightly starting tonight, pt had PD during the daytime yesterday.

## 2020-10-26 NOTE — Plan of Care (Signed)
  Problem: Education: Goal: Knowledge of risk factors and measures for prevention of condition will improve Outcome: Progressing   Problem: Coping: Goal: Psychosocial and spiritual needs will be supported Outcome: Progressing   Problem: Respiratory: Goal: Will maintain a patent airway Outcome: Progressing Goal: Complications related to the disease process, condition or treatment will be avoided or minimized Outcome: Progressing   Problem: Education: Goal: Knowledge of General Education information will improve Description: Including pain rating scale, medication(s)/side effects and non-pharmacologic comfort measures Outcome: Progressing   Problem: Clinical Measurements: Goal: Ability to maintain clinical measurements within normal limits will improve Outcome: Progressing Goal: Will remain free from infection Outcome: Progressing Goal: Diagnostic test results will improve Outcome: Progressing Goal: Respiratory complications will improve Outcome: Progressing Goal: Cardiovascular complication will be avoided Outcome: Progressing   Problem: Elimination: Goal: Will not experience complications related to bowel motility Outcome: Progressing Goal: Will not experience complications related to urinary retention Outcome: Progressing   Problem: Pain Managment: Goal: General experience of comfort will improve Outcome: Progressing   Problem: Safety: Goal: Ability to remain free from injury will improve Outcome: Progressing   Problem: Skin Integrity: Goal: Risk for impaired skin integrity will decrease Outcome: Progressing

## 2020-10-26 NOTE — Progress Notes (Addendum)
    BRIEF OVERNIGHT PROGRESS REPORT    SUBJECTIVE: Notified by primary RN that patient was in "distress" and struggling to breath. He was noted with increased working of breathing and unable to talk in complete sentences. Per RN, patient was hypoxic with sats in the low 80s and very tachypneic.  OBJECTIVE: On arrival to the bedside, he was afebrile with blood pressure 151/70 mm Hg and pulse rate 70, beats/min, sats initially 90% on 6L but dropped to low 60s. He was in moderate respiratory distress with increased work of breathing and "grey" looking. Lungs very coarse with moderate wheezing bilaterally. He was placed immediately on HFNC 100%60L  ASSESSMENT & PLAN:  Acute Hypoxic Respiratory Failure secondary to COVID-19 Pneumonia & COPD with evidence of acute exacerbation. CRP improving 7.7>3.7 although now with worsening Leukocytosis 17.9>19.1>20.3, elevated PCT 2.33  Severe hypoxia on blood gas AND worsening  multifocal pneumonia on repeat chest xray. -Start Empiric abx coverage given risk factors (COPD + Chronic renal disease on PD) with Ceftriaxone + Azithromycin pending cultures, may broaden if appropriate -Supplemental O2 as needed to maintain O2 saturations 88 to 92% -Follow intermittent ABG and chest x-ray as needed -As needed bronchodilators -Continue remdesivir x5 days (11/9 - 11/13) -Continue steroids. -Encourage OOB, IS, FV, and awake proning if able -Continue airborne, contact precautions for 21 days from positive testing. -Monitor CMP and inflammatory markers -Heparin prophylactic dose.      Rufina Falco, BSN, MSN, DNP, [employer]  [facility] Nurse Practitioner  [facility]

## 2020-10-26 NOTE — Progress Notes (Signed)
Pt placed on  PD.  Will remove IN AM after treatment complete

## 2020-10-26 NOTE — Progress Notes (Signed)
Patient is awake and is trying to sleep.  Able to state his name and that he is in [employer], but seems altered.  He is grey looking and seems very tired. VSS. O2 sats in the high 90's on HFNC. Dr. Billie Ruddy made aware.  MD will placed orders to transfer patient to progressive care.

## 2020-10-27 DIAGNOSIS — U071 COVID-19: Secondary | ICD-10-CM | POA: Diagnosis not present

## 2020-10-27 DIAGNOSIS — J9601 Acute respiratory failure with hypoxia: Secondary | ICD-10-CM | POA: Diagnosis not present

## 2020-10-27 LAB — BASIC METABOLIC PANEL
Anion gap: 28 — ABNORMAL HIGH (ref 5–15)
BUN: 243 mg/dL — ABNORMAL HIGH (ref 8–23)
CO2: 15 mmol/L — ABNORMAL LOW (ref 22–32)
Calcium: 6.9 mg/dL — ABNORMAL LOW (ref 8.9–10.3)
Chloride: 96 mmol/L — ABNORMAL LOW (ref 98–111)
Creatinine, Ser: 11.09 mg/dL — ABNORMAL HIGH (ref 0.61–1.24)
GFR, Estimated: 5 mL/min — ABNORMAL LOW (ref >60)
Glucose, Bld: 210 mg/dL — ABNORMAL HIGH (ref 70–99)
Potassium: 5.7 mmol/L — ABNORMAL HIGH (ref 3.5–5.1)
Sodium: 139 mmol/L (ref 135–145)

## 2020-10-27 LAB — GLUCOSE, CAPILLARY
Glucose-Capillary: 134 mg/dL — ABNORMAL HIGH (ref 70–99)
Glucose-Capillary: 148 mg/dL — ABNORMAL HIGH (ref 70–99)
Glucose-Capillary: 178 mg/dL — ABNORMAL HIGH (ref 70–99)
Glucose-Capillary: 137 mg/dL — ABNORMAL HIGH (ref 70–99)

## 2020-10-27 LAB — CBC
HCT: 35.4 % — ABNORMAL LOW (ref 39.0–52.0)
Hemoglobin: 12.6 g/dL — ABNORMAL LOW (ref 13.0–17.0)
MCH: 31.7 pg (ref 26.0–34.0)
MCHC: 35.6 g/dL (ref 30.0–36.0)
MCV: 89.2 fL (ref 80.0–100.0)
Platelets: 167 10*3/uL (ref 150–400)
RBC: 3.97 MIL/uL — ABNORMAL LOW (ref 4.22–5.81)
RDW: 12.8 % (ref 11.5–15.5)
WBC: 23.2 10*3/uL — ABNORMAL HIGH (ref 4.0–10.5)
nRBC: 0 % (ref 0.0–0.2)

## 2020-10-27 LAB — PROCALCITONIN: Procalcitonin: 4.43 ng/mL

## 2020-10-27 LAB — C-REACTIVE PROTEIN: CRP: 9.8 mg/dL — ABNORMAL HIGH (ref <1.0)

## 2020-10-27 LAB — MAGNESIUM: Magnesium: 2.2 mg/dL (ref 1.7–2.4)

## 2020-10-27 MED ORDER — METHYLPREDNISOLONE SODIUM SUCC 125 MG IJ SOLR
60.0000 mg | Freq: Two times a day (BID) | INTRAMUSCULAR | Status: DC
  Administered 2020-10-27 – 2020-10-30 (×6): 60 mg via INTRAVENOUS
  Filled 2020-10-27 (×6): qty 2

## 2020-10-27 MED ORDER — PATIROMER SORBITEX CALCIUM 8.4 G PO PACK
16.8000 g | PACK | Freq: Every day | ORAL | Status: AC
  Administered 2020-10-27: 16.8 g via ORAL
  Filled 2020-10-27 (×2): qty 2

## 2020-10-27 NOTE — Progress Notes (Signed)
PROGRESS NOTE    Willie Hahn  NOB:[id number] DOB: [birth year] DOA: 10/17/2020 PCP: Birdie Sons, MD    Assessment & Plan:   Principal Problem:   Acute hypoxemic respiratory failure due to COVID-19 [facility]) Active Problems:   Coronary artery disease   Essential hypertension   Insomnia   Compulsive tobacco user syndrome   H/O placement of stent in anterior descending branch of left coronary artery   Mixed hyperlipidemia   CKD (chronic kidney disease) stage V requiring chronic dialysis ([place])   Focal segmental glomerulosclerosis   Pneumonia due to COVID-19 virus    Willie Hahn is a 64 y.o. male with medical history significant for hypertension, hyperlipidemia, gout in remission, end-stage renal disease on peritoneal dialysis presented to the emergency department for chief concerns of shortness of breath x6 days worsening with minimal improvement with home inhaler for his COPD.   Acute hypoxic respiratory failure secondary to COVID-19 PNA, worsening -Status post Solu-Medrol 125 mg IV per ED provider --O2 requirement was 6L, had acute worsening of hypoxic respiratory failure overnight 11/13, had to be put on heated hf. --CRP 7.7, trending down initially, now trending back up --procal 2.33, empiric ceftriaxone/azithromycin started. --completed Remdesivir PLAN: --increase steroid back up to solumedrol 60 mg BID due to CRP increasing --trend CRP --cont Combivent QID --cont ceftriaxone and azithromycin --Continue supplemental O2 to keep sats between 88-92%, wean as tolerated  CAD -appears stable at this time no clinical indication for ACS --on home atorvastatin 80 mg daily, Coreg 25 mg twice daily, Imdur 30 mg daily, aspirin 81 mg daily PLAN: --cont home coreg --Hold Imdur --cont ASA and statin  COPD  --Continue supplemental O2 to keep sats between 88-92%, wean as tolerated -cont Combivent as QID -Albuterol PRN q4h for shortness of breath and wheezing  Hypertension   --BP varied -On home amlodipine 10 mg daily, carvedilol 25 mg bid, hydralazine 50 mg TID, losartan-hctz 100-12.5 daily at home, torsemide PLAN: --cont home coreg and torsemide --Hold rest of BP meds  ESRD on peritoneal dialysis Secondary hyperparathyroidism -Status post 500 cc LR bolus in the ED -Patient endorses compliance with peritoneal dialysis -Nephrology has been consulted -Patient is still urinating PLAN: --cont home torsemide 100 mg daily --cont inpatient PD per nephrology --cont Calcitriol and Ca Carbonate  Hyperlipidemia -cont atorvastatin 80 mg daily  Tobacco user -patient not ready to quit  Hyperglycemia, likely due to steroid use Prediabetic --A1c 6.1 PLAN: --cont Levemir 5u BID --SSI   DVT prophylaxis: Heparin SQ Code Status: Limited code No intubation, confirmed with daughter. Family Communication:  Status is: inpatient Dispo:   The patient is from: home Anticipated d/c is to: home Anticipated d/c date is: >3 days Patient currently is not medically stable to d/c due to: on heated hf   Subjective and Interval History:  Pt was noted to pull of his suppl O2 and causing desat.  Had BM today.  Pt has been withdrawn, not responding to most questions.   Objective: Vitals:   10/27/20 0609 10/27/20 0841 10/27/20 1209 10/27/20 1405  BP:  (!) 152/83 129/89   Pulse:  75 74   Resp: 18 19 (!) 24   Temp:  97.7 F (36.5 C) 97.9 F (36.6 C)   TempSrc:  Oral Oral   SpO2:  100% 99% 100%  Weight:      Height:        Intake/Output Summary (Last 24 hours) at 10/27/2020 1554 Last data filed at 10/27/2020 1542 Gross per  24 hour  Intake 700 ml  Output 0 ml  Net 700 ml   Filed Weights   10/25/20 0500 10/27/20 0110 10/27/20 0346  Weight: 77.2 kg 81 kg 81 kg    Examination:  Constitutional: NAD, somnolent, not responding to questions HEENT: conjunctivae and lids normal, EOMI CV: No cyanosis.   RESP: on heated hf Extremities: No effusions, edema in  BLE SKIN: warm, dry and intact    Data Reviewed: I have personally reviewed following labs and imaging studies  CBC: Recent Labs  Lab 10/19/2020 1213 10/23/20 0549 10/24/20 0522 10/25/20 0542 10/26/20 0005 10/26/20 0529 10/27/20 0500  WBC 5.9   < > 17.9* 19.1* 20.3* 18.4* 23.2*  NEUTROABS 4.4  --   --   --   --   --   --   HGB 13.9   < > 12.7* 13.5 13.0 12.2* 12.6*  HCT 40.6   < > 35.9* 37.1* 36.1* 33.8* 35.4*  MCV 93.8   < > 90.9 88.8 88.0 88.7 89.2  PLT 107*   < > 127* 151 148* 139* 167   < > = values in this interval not displayed.   Basic Metabolic Panel: Recent Labs  Lab 10/23/20 0549 10/23/20 0549 10/24/20 0522 10/25/20 0542 10/26/20 0005 10/26/20 0529 10/27/20 0500  NA 133*   < > 134* 135 135 134* 139  K 4.5   < > 4.4 4.9 5.6* 5.7* 5.7*  CL 92*   < > 94* 96* 94* 93* 96*  CO2 19*   < > 19* 16* 16* 15* 15*  GLUCOSE 229*   < > 229* 197* 210* 228* 210*  BUN 130*   < > 152* 187* 235* 242* 243*  CREATININE 10.98*   < > 10.93* 11.41* 12.00* 12.15* 11.09*  CALCIUM 7.2*   < > 7.2* 7.2* 7.3* 7.3* 6.9*  MG 1.9  --  1.9 1.9  --  2.2 2.2   < > = values in this interval not displayed.   GFR: Estimated Creatinine Clearance: 6.9 mL/min (A) (by C-G formula based on SCr of 11.09 mg/dL (H)). Liver Function Tests: Recent Labs  Lab 10/22/20 1431  AST 52*  ALT 21  ALKPHOS 55  BILITOT 0.7  PROT 5.8*  ALBUMIN 2.7*   No results for input(s): LIPASE, AMYLASE in the last 168 hours. No results for input(s): AMMONIA in the last 168 hours. Coagulation Profile: No results for input(s): INR, PROTIME in the last 168 hours. Cardiac Enzymes: No results for input(s): CKTOTAL, CKMB, CKMBINDEX, TROPONINI in the last 168 hours. BNP (last 3 results) No results for input(s): PROBNP in the last 8760 hours. HbA1C: No results for input(s): HGBA1C in the last 72 hours. CBG: Recent Labs  Lab 10/26/20 1202 10/26/20 1654 10/26/20 2142 10/27/20 0837 10/27/20 1222  GLUCAP 200* 137*  173* 134* 148*   Lipid Profile: No results for input(s): CHOL, HDL, LDLCALC, TRIG, CHOLHDL, LDLDIRECT in the last 72 hours. Thyroid Function Tests: No results for input(s): TSH, T4TOTAL, FREET4, T3FREE, THYROIDAB in the last 72 hours. Anemia Panel: No results for input(s): VITAMINB12, FOLATE, FERRITIN, TIBC, IRON, RETICCTPCT in the last 72 hours. Sepsis Labs: Recent Labs  Lab 10/26/20 0005 10/26/20 0529 10/27/20 0500  PROCALCITON 2.33 2.28 4.43  LATICACIDVEN  --  1.2  --     Recent Results (from the past 240 hour(s))  Respiratory Panel by RT PCR (Flu A&B, Covid) - Nasopharyngeal Swab     Status: Abnormal   Collection Time: 10/20/2020  1:16 PM   Specimen: Nasopharyngeal Swab  Result Value Ref Range Status   SARS Coronavirus 2 by RT PCR POSITIVE (A) NEGATIVE Final    Comment: RESULT CALLED TO, READ BACK BY AND VERIFIED WITH: REID RENO 10/17/2020 1433 KLW (NOTE) SARS-CoV-2 target nucleic acids are DETECTED.  SARS-CoV-2 RNA is generally detectable in upper respiratory specimens  during the acute phase of infection. Positive results are indicative of the presence of the identified virus, but do not rule out bacterial infection or co-infection with other pathogens not detected by the test. Clinical correlation with patient history and other diagnostic information is necessary to determine patient infection status. The expected result is Negative.  Fact Sheet for Patients:  PinkCheek.be  Fact Sheet for Healthcare Providers: GravelBags.it  This test is not yet approved or cleared by the [place] FDA and  has been authorized for detection and/or diagnosis of SARS-CoV-2 by FDA under an Emergency Use Authorization (EUA).  This EUA will remain in effect (meaning this test can be used) fo r the duration of  the COVID-19 declaration under Section 564(b)(1) of the Act, 21 U.S.C. section 360bbb-3(b)(1), unless the authorization  is terminated or revoked sooner.      Influenza A by PCR NEGATIVE NEGATIVE Final   Influenza B by PCR NEGATIVE NEGATIVE Final    Comment: (NOTE) The Xpert Xpress SARS-CoV-2/FLU/RSV assay is intended as an aid in  the diagnosis of influenza from Nasopharyngeal swab specimens and  should not be used as a sole basis for treatment. Nasal washings and  aspirates are unacceptable for Xpert Xpress SARS-CoV-2/FLU/RSV  testing.  Fact Sheet for Patients: PinkCheek.be  Fact Sheet for Healthcare Providers: GravelBags.it  This test is not yet approved or cleared by the [place] FDA and  has been authorized for detection and/or diagnosis of SARS-CoV-2 by  FDA under an Emergency Use Authorization (EUA). This EUA will remain  in effect (meaning this test can be used) for the duration of the  Covid-19 declaration under Section 564(b)(1) of the Act, 21  U.S.C. section 360bbb-3(b)(1), unless the authorization is  terminated or revoked. Performed at [facility], [address] [place], [place] [address]   CULTURE, BLOOD (ROUTINE X 2) w Reflex to ID Panel     Status: None (Preliminary result)   Collection Time: 10/26/20  5:29 AM   Specimen: Left Antecubital; Blood  Result Value Ref Range Status   Specimen Description LEFT ANTECUBITAL  Final   Special Requests   Final    BOTTLES DRAWN AEROBIC AND ANAEROBIC Blood Culture adequate volume   Culture   Final    NO GROWTH 1 DAY Performed at [facility], [address], [place], [place] [address]    Report Status PENDING  Incomplete  CULTURE, BLOOD (ROUTINE X 2) w Reflex to ID Panel     Status: None (Preliminary result)   Collection Time: 10/26/20  7:19 AM   Specimen: BLOOD  Result Value Ref Range Status   Specimen Description BLOOD RAC  Final   Special Requests   Final    BOTTLES DRAWN AEROBIC AND ANAEROBIC Blood Culture adequate volume   Culture   Final    NO  GROWTH < 24 HOURS Performed at [facility], [address] [place], [place] [address]    Report Status PENDING  Incomplete      Radiology Studies: DG Chest 1 View  Result Date: 10/26/2020 CLINICAL DATA:  Shortness of EXAM: CHEST  1 VIEW COMPARISON:  Breath 10/14/2020 FINDINGS: Worsening of bilateral mid lung opacities, likely indicating multifocal pneumonia. Normal pleural spaces and cardiomediastinal contours. IMPRESSION: Slight worsening of multifocal pneumonia. Electronically Signed   By: Ulyses Jarred M.D.   On: 10/26/2020 00:10     Scheduled Meds: . aspirin  81 mg Oral Daily  . atorvastatin  80 mg Oral Daily  . calcitRIOL  0.25 mcg Oral Daily  . calcium carbonate  1 tablet Oral BID WC  . carvedilol  25 mg Oral BID  . dexamethasone  6 mg Oral Daily  . gentamicin cream  1 application Topical Daily  . heparin  5,000 Units Subcutaneous Q8H  . insulin aspart  0-15 Units Subcutaneous TID WC  . insulin aspart  0-5 Units Subcutaneous QHS  . insulin detemir  5 Units Subcutaneous BID  . Ipratropium-Albuterol  1 puff Inhalation QID  . mouth rinse  15 mL Mouth Rinse BID  . patiromer  16.8 g Oral Daily  . torsemide  100 mg Oral Daily   Continuous Infusions: . sodium chloride 500 mL (10/26/20 1115)  . azithromycin 500 mg (10/27/20 0505)  . cefTRIAXone (ROCEPHIN)  IV 2 g (10/27/20 0340)  . dialysis solution 1.5% low-MG/low-CA       LOS: 5 days     Enzo Bi, MD [facility] If 7PM-7AM, please contact night-coverage 10/27/2020, 3:54 PM

## 2020-10-27 NOTE — Progress Notes (Signed)
Patient getting up out of chair and removing HFNC.  O2 sat dropping to 78%, patient cyanotic with RA.  Replaced HFNC, O2 sats recovered quickly.  Patient was alert and oriented x3 during episode, but seem very out of it.   Encouraged patient to keep O2 on.

## 2020-10-27 NOTE — Progress Notes (Signed)
[facility]  ROUNDING NOTE   Subjective:  Patient found sitting up in a chair, near the bed, RN assisting with medications.Patient appears in moderate distress. Vital signs stable,but appears fatigued with increased work of breathing. RN reports patient desaturating to 78% when he took his O2 off, SpO2 currently 100% with O2 and HR in 70's.  Objective:  Vital signs in last 24 hours:  Temp:  [97.7 F (36.5 C)-98.7 F (37.1 C)] 97.9 F (36.6 C) (11/15 1209) Pulse Rate:  [74-77] 74 (11/15 1209) Resp:  [14-24] 24 (11/15 1209) BP: (129-152)/(66-89) 129/89 (11/15 1209) SpO2:  [98 %-100 %] 100 % (11/15 1405) FiO2 (%):  [50 %-100 %] 50 % (11/15 1405) Weight:  [81 kg] 81 kg (11/15 0346)  Weight change:  Filed Weights   10/25/20 0500 10/27/20 0110 10/27/20 0346  Weight: 77.2 kg 81 kg 81 kg    Intake/Output: I/O last 3 completed shifts: In: 700.1 [IV Piggyback:700.1] Out: 200 [Urine:200]   Intake/Output this shift:  No intake/output data recorded.  Physical Exam: General: In moderate distress  Head: Normocephalic,atraumatic  Eyes: Sclerae and conjunctivae clear  Lungs:  Respiration labored, increased work of breathing,On 6LO2 , Lungs diminished at the bases  Heart: Regular rate and rhythm, HR 70'S  Abdomen:  Soft, nontender, non distended  Extremities:  No  peripheral edema.  Neurologic: Awake,alert,able to follow commands  Skin: No acute lesions or rashes  Access: Peritoneal  catheter    Basic Metabolic Panel: Recent Labs  Lab 10/23/20 0549 10/23/20 0549 10/24/20 0522 10/24/20 0522 10/25/20 0542 10/25/20 0542 10/26/20 0005 10/26/20 0529 10/27/20 0500  NA 133*   < > 134*  --  135  --  135 134* 139  K 4.5   < > 4.4  --  4.9  --  5.6* 5.7* 5.7*  CL 92*   < > 94*  --  96*  --  94* 93* 96*  CO2 19*   < > 19*  --  16*  --  16* 15* 15*  GLUCOSE 229*   < > 229*  --  197*  --  210* 228* 210*  BUN 130*   < > 152*  --  187*  --  235* 242* 243*  CREATININE  10.98*   < > 10.93*  --  11.41*  --  12.00* 12.15* 11.09*  CALCIUM 7.2*   < > 7.2*   < > 7.2*   < > 7.3* 7.3* 6.9*  MG 1.9  --  1.9  --  1.9  --   --  2.2 2.2   < > = values in this interval not displayed.    Liver Function Tests: Recent Labs  Lab 10/22/20 1431  AST 52*  ALT 21  ALKPHOS 55  BILITOT 0.7  PROT 5.8*  ALBUMIN 2.7*   No results for input(s): LIPASE, AMYLASE in the last 168 hours. No results for input(s): AMMONIA in the last 168 hours.  CBC: Recent Labs  Lab 11/08/2020 1213 10/23/20 0549 10/24/20 0522 10/25/20 0542 10/26/20 0005 10/26/20 0529 10/27/20 0500  WBC 5.9   < > 17.9* 19.1* 20.3* 18.4* 23.2*  NEUTROABS 4.4  --   --   --   --   --   --   HGB 13.9   < > 12.7* 13.5 13.0 12.2* 12.6*  HCT 40.6   < > 35.9* 37.1* 36.1* 33.8* 35.4*  MCV 93.8   < > 90.9 88.8 88.0 88.7 89.2  PLT 107*   < >  127* 151 148* 139* 167   < > = values in this interval not displayed.    Cardiac Enzymes: No results for input(s): CKTOTAL, CKMB, CKMBINDEX, TROPONINI in the last 168 hours.  BNP: Invalid input(s): POCBNP  CBG: Recent Labs  Lab 10/26/20 1202 10/26/20 1654 10/26/20 2142 10/27/20 0837 10/27/20 1222  GLUCAP 200* 137* 173* 134* 148*    Microbiology: Results for orders placed or performed during the hospital encounter of 10/28/2020  Respiratory Panel by RT PCR (Flu A&B, Covid) - Nasopharyngeal Swab     Status: Abnormal   Collection Time: 11/05/2020  1:16 PM   Specimen: Nasopharyngeal Swab  Result Value Ref Range Status   SARS Coronavirus 2 by RT PCR POSITIVE (A) NEGATIVE Final    Comment: RESULT CALLED TO, READ BACK BY AND VERIFIED WITH: REID RENO 11/05/2020 1433 KLW (NOTE) SARS-CoV-2 target nucleic acids are DETECTED.  SARS-CoV-2 RNA is generally detectable in upper respiratory specimens  during the acute phase of infection. Positive results are indicative of the presence of the identified virus, but do not rule out bacterial infection or co-infection with other  pathogens not detected by the test. Clinical correlation with patient history and other diagnostic information is necessary to determine patient infection status. The expected result is Negative.  Fact Sheet for Patients:  PinkCheek.be  Fact Sheet for Healthcare Providers: GravelBags.it  This test is not yet approved or cleared by the [place] FDA and  has been authorized for detection and/or diagnosis of SARS-CoV-2 by FDA under an Emergency Use Authorization (EUA).  This EUA will remain in effect (meaning this test can be used) fo r the duration of  the COVID-19 declaration under Section 564(b)(1) of the Act, 21 U.S.C. section 360bbb-3(b)(1), unless the authorization is terminated or revoked sooner.      Influenza A by PCR NEGATIVE NEGATIVE Final   Influenza B by PCR NEGATIVE NEGATIVE Final    Comment: (NOTE) The Xpert Xpress SARS-CoV-2/FLU/RSV assay is intended as an aid in  the diagnosis of influenza from Nasopharyngeal swab specimens and  should not be used as a sole basis for treatment. Nasal washings and  aspirates are unacceptable for Xpert Xpress SARS-CoV-2/FLU/RSV  testing.  Fact Sheet for Patients: PinkCheek.be  Fact Sheet for Healthcare Providers: GravelBags.it  This test is not yet approved or cleared by the [place] FDA and  has been authorized for detection and/or diagnosis of SARS-CoV-2 by  FDA under an Emergency Use Authorization (EUA). This EUA will remain  in effect (meaning this test can be used) for the duration of the  Covid-19 declaration under Section 564(b)(1) of the Act, 21  U.S.C. section 360bbb-3(b)(1), unless the authorization is  terminated or revoked. Performed at [facility], [address] [place], [place] [address]   CULTURE, BLOOD (ROUTINE X 2) w Reflex to ID Panel     Status: None (Preliminary result)    Collection Time: 10/26/20  5:29 AM   Specimen: Left Antecubital; Blood  Result Value Ref Range Status   Specimen Description LEFT ANTECUBITAL  Final   Special Requests   Final    BOTTLES DRAWN AEROBIC AND ANAEROBIC Blood Culture adequate volume   Culture   Final    NO GROWTH 1 DAY Performed at [facility], [address], [place], [place] [address]    Report Status PENDING  Incomplete  CULTURE, BLOOD (ROUTINE X 2) w Reflex to ID Panel     Status: None (Preliminary result)   Collection Time:  10/26/20  7:19 AM   Specimen: BLOOD  Result Value Ref Range Status   Specimen Description BLOOD RAC  Final   Special Requests   Final    BOTTLES DRAWN AEROBIC AND ANAEROBIC Blood Culture adequate volume   Culture   Final    NO GROWTH < 24 HOURS Performed at [facility], [address] [place], [place]  [address]    Report Status PENDING  Incomplete    Coagulation Studies: No results for input(s): LABPROT, INR in the last 72 hours.  Urinalysis: No results for input(s): COLORURINE, LABSPEC, PHURINE, GLUCOSEU, HGBUR, BILIRUBINUR, KETONESUR, PROTEINUR, UROBILINOGEN, NITRITE, LEUKOCYTESUR in the last 72 hours.  Invalid input(s): APPERANCEUR    Imaging: DG Chest 1 View  Result Date: 10/26/2020 CLINICAL DATA:  Shortness of EXAM: CHEST  1 VIEW COMPARISON:  Breath 10/23/2020 FINDINGS: Worsening of bilateral mid lung opacities, likely indicating multifocal pneumonia. Normal pleural spaces and cardiomediastinal contours. IMPRESSION: Slight worsening of multifocal pneumonia. Electronically Signed   By: Ulyses Jarred M.D.   On: 10/26/2020 00:10     Medications:    sodium chloride 500 mL (10/26/20 1115)   azithromycin 500 mg (10/27/20 0505)   cefTRIAXone (ROCEPHIN)  IV 2 g (10/27/20 0340)   dialysis solution 1.5% low-MG/low-CA      aspirin  81 mg Oral Daily   atorvastatin  80 mg Oral Daily   baricitinib  2 mg Oral Daily   calcitRIOL  0.25 mcg Oral Daily    calcium carbonate  1 tablet Oral BID WC   carvedilol  25 mg Oral BID   dexamethasone  6 mg Oral Daily   gentamicin cream  1 application Topical Daily   heparin  5,000 Units Subcutaneous Q8H   insulin aspart  0-15 Units Subcutaneous TID WC   insulin aspart  0-5 Units Subcutaneous QHS   insulin detemir  5 Units Subcutaneous BID   Ipratropium-Albuterol  1 puff Inhalation QID   mouth rinse  15 mL Mouth Rinse BID   patiromer  16.8 g Oral Daily   torsemide  100 mg Oral Daily     Assessment/ Plan:  Willie Hahn is a 64 y.o.  male  has h/o COPD,CAD and ESRD on peritoneal dialysis.He came to ED with SOB, diagnosed with COVID 19 infection.   # ESRD on peritoneal dialysis  Patient receiving peritoneal dialysis treatments nightly. No reported problems with PD Will plan for  PD session tonight again   #Secondary hyperparathyroidism Lab Results  Component Value Date   CALCIUM 6.9 (L) 10/27/2020    On Calcitriol and Calcium Carbonate   #Hypertension BP 152/83 today Patient is on Torsemide and Carvedilol  # Covid 19 pneumonia Chest xray on 10/25/2020 IMPRESSION: Slight worsening of multifocal pneumonia.  Receiving Remdesivir and Decadron for Covid 19  Also on Azithromycin and Ceftriaxone Patient continues to require O2 support, per nursing reports, he desaturates quickly, when he takes O2 off. Bronchodilators as needed Management per primary team   #Hyperkalemia Potassium 5.7 Veltassa 16.8 gm today   LOS: 5 Quiana Cobaugh 11/15/20212:34 PM

## 2020-10-27 NOTE — Progress Notes (Signed)
°   10/26/20 1213 10/26/20 1216  Assess: MEWS Score  Temp (!) 97.4 F (36.3 C)  --   BP 102/81  --   Pulse Rate 79  --   Resp (!) 42 (!) 27  SpO2 90 % 96 %  O2 Device  --   (Heated HFNC)  O2 Flow Rate (L/min)  --  60 L/min  Assess: MEWS Score  MEWS Temp 0 0  MEWS Systolic 0 0  MEWS Pulse 0 0  MEWS RR 3 2  MEWS LOC 0 0  MEWS Score 3 2  MEWS Score Color Yellow Yellow  Assess: if the MEWS score is Yellow or Red  Were vital signs taken at a resting state?  --  No (Pt sits on the bsc)  Focused Assessment  --  No change from prior assessment  Early Detection of Sepsis Score *See Row Information*  --  High  MEWS guidelines implemented *See Row Information*  --  Yes  Treat  MEWS Interventions  --  Escalated (See documentation below) (See below progress note. )  Pain Scale  --  0-10  Pain Score  --  0  Take Vital Signs  Increase Vital Sign Frequency   --  Yellow: Q 2hr X 2 then Q 4hr X 2, if remains yellow, continue Q 4hrs  Escalate  MEWS: Escalate  --  Yellow: discuss with charge nurse/RN and consider discussing with provider and RRT  Notify: Charge Nurse/RN  Name of Charge Nurse/RN Notified  --  Allen Kell, RN  Date Charge Nurse/RN Notified  --  10/26/20  Time Charge Nurse/RN Notified  --  1223  Document  Patient Outcome  --  Other (Comment) (See progress note.)  Progress note created (see row info)  --  Yes  copied on behalf of Wilnette Kales

## 2020-10-27 NOTE — Progress Notes (Signed)
Called- spoke to Daughter .  Gave update on father.

## 2020-10-27 NOTE — Progress Notes (Signed)
Patient seem more alert this afternoon.  He was able to communicate with in 1-2 words, but got very SOB afterward.   Patient had 2 bowel movement that were very loose.

## 2020-10-28 DIAGNOSIS — J9601 Acute respiratory failure with hypoxia: Secondary | ICD-10-CM | POA: Diagnosis not present

## 2020-10-28 DIAGNOSIS — U071 COVID-19: Secondary | ICD-10-CM | POA: Diagnosis not present

## 2020-10-28 LAB — BASIC METABOLIC PANEL
Anion gap: 23 — ABNORMAL HIGH (ref 5–15)
BUN: 242 mg/dL — ABNORMAL HIGH (ref 8–23)
CO2: 16 mmol/L — ABNORMAL LOW (ref 22–32)
Calcium: 6.5 mg/dL — ABNORMAL LOW (ref 8.9–10.3)
Chloride: 97 mmol/L — ABNORMAL LOW (ref 98–111)
Creatinine, Ser: 10.46 mg/dL — ABNORMAL HIGH (ref 0.61–1.24)
GFR, Estimated: 5 mL/min — ABNORMAL LOW (ref >60)
Glucose, Bld: 265 mg/dL — ABNORMAL HIGH (ref 70–99)
Potassium: 4.6 mmol/L (ref 3.5–5.1)
Sodium: 136 mmol/L (ref 135–145)

## 2020-10-28 LAB — BLOOD GAS, ARTERIAL
Acid-base deficit: 6.2 mmol/L — ABNORMAL HIGH (ref 0.0–2.0)
Bicarbonate: 17.1 mmol/L — ABNORMAL LOW (ref 20.0–28.0)
FIO2: 0.5
O2 Saturation: 90.8 %
Patient temperature: 37
pCO2 arterial: 27 mmHg — ABNORMAL LOW (ref 32.0–48.0)
pH, Arterial: 7.41 (ref 7.350–7.450)
pO2, Arterial: 60 mmHg — ABNORMAL LOW (ref 83.0–108.0)

## 2020-10-28 LAB — CBC
HCT: 31.1 % — ABNORMAL LOW (ref 39.0–52.0)
Hemoglobin: 10.8 g/dL — ABNORMAL LOW (ref 13.0–17.0)
MCH: 31.4 pg (ref 26.0–34.0)
MCHC: 34.7 g/dL (ref 30.0–36.0)
MCV: 90.4 fL (ref 80.0–100.0)
Platelets: 127 10*3/uL — ABNORMAL LOW (ref 150–400)
RBC: 3.44 MIL/uL — ABNORMAL LOW (ref 4.22–5.81)
RDW: 12.7 % (ref 11.5–15.5)
WBC: 11.3 10*3/uL — ABNORMAL HIGH (ref 4.0–10.5)
nRBC: 0 % (ref 0.0–0.2)

## 2020-10-28 LAB — PROCALCITONIN: Procalcitonin: 3.47 ng/mL

## 2020-10-28 LAB — GLUCOSE, CAPILLARY
Glucose-Capillary: 191 mg/dL — ABNORMAL HIGH (ref 70–99)
Glucose-Capillary: 148 mg/dL — ABNORMAL HIGH (ref 70–99)
Glucose-Capillary: 138 mg/dL — ABNORMAL HIGH (ref 70–99)
Glucose-Capillary: 165 mg/dL — ABNORMAL HIGH (ref 70–99)

## 2020-10-28 LAB — MAGNESIUM: Magnesium: 2.3 mg/dL (ref 1.7–2.4)

## 2020-10-28 MED ORDER — INSULIN DETEMIR 100 UNIT/ML ~~LOC~~ SOLN
10.0000 [IU] | Freq: Two times a day (BID) | SUBCUTANEOUS | Status: DC
  Administered 2020-10-28 – 2020-10-29 (×3): 10 [IU] via SUBCUTANEOUS
  Filled 2020-10-28 (×5): qty 0.1

## 2020-10-28 MED ORDER — LOSARTAN POTASSIUM 50 MG PO TABS
100.0000 mg | ORAL_TABLET | Freq: Every day | ORAL | Status: DC
  Administered 2020-10-28 – 2020-10-30 (×2): 100 mg via ORAL
  Filled 2020-10-28 (×3): qty 2

## 2020-10-28 MED ORDER — HYDROCHLOROTHIAZIDE 12.5 MG PO CAPS: 12.5000 mg | ORAL_CAPSULE | Freq: Every day | ORAL | Status: DC

## 2020-10-28 MED ORDER — RENA-VITE PO TABS
1.0000 | ORAL_TABLET | Freq: Every day | ORAL | Status: DC
  Administered 2020-10-30: 1 via ORAL
  Filled 2020-10-28 (×3): qty 1

## 2020-10-28 MED ORDER — HYDROCHLOROTHIAZIDE 12.5 MG PO CAPS
12.5000 mg | ORAL_CAPSULE | Freq: Every day | ORAL | Status: DC
  Administered 2020-10-28 – 2020-10-30 (×2): 12.5 mg via ORAL
  Filled 2020-10-28 (×3): qty 1

## 2020-10-28 MED ORDER — LOSARTAN POTASSIUM 50 MG PO TABS: 100.0000 mg | ORAL_TABLET | Freq: Every day | ORAL | Status: DC

## 2020-10-28 MED ORDER — NEPRO/CARBSTEADY PO LIQD
237.0000 mL | Freq: Two times a day (BID) | ORAL | Status: DC
  Administered 2020-10-29 – 2020-10-30 (×3): 237 mL via ORAL

## 2020-10-28 MED ORDER — LOSARTAN POTASSIUM-HCTZ 100-12.5 MG PO TABS: 1.0000 | ORAL_TABLET | Freq: Every day | ORAL | Status: DC

## 2020-10-28 NOTE — Progress Notes (Addendum)
[facility]  ROUNDING NOTE   Subjective:  Patient sleeping in bed, arousable to call.  He appears lethargic, able to follow simple commands such as 'squeeze my hands'.  Objective:  Vital signs in last 24 hours:  Temp:  [97.7 F (36.5 C)-98.4 F (36.9 C)] 97.7 F (36.5 C) (11/16 1046) Pulse Rate:  [74-80] 75 (11/16 1046) Resp:  [19-24] 20 (11/16 1046) BP: (129-160)/(66-89) 159/81 (11/16 1046) SpO2:  [90 %-100 %] 92 % (11/16 1046) FiO2 (%):  [50 %-100 %] 50 % (11/16 0429) Weight:  [81.9 kg] 81.9 kg (11/16 0521)  Weight change: 0.9 kg Filed Weights   10/27/20 0110 10/27/20 0346 10/28/20 0521  Weight: 81 kg 81 kg 81.9 kg    Intake/Output: I/O last 3 completed shifts: In: 700 [IV Piggyback:700] Out: 450 [Urine:450]   Intake/Output this shift:  No intake/output data recorded.  Physical Exam: General: Sleeping in bed, in no acute distress  Head: Normocephalic,atraumatic  Eyes:  Anicteric  Lungs:   Lungs diminished at the bases, respirations symmetrical and unlabored  Heart:  S1-S2, no rubs or gallops  Abdomen:  Soft, nontender, non distended  Extremities:  No  peripheral edema.  Neurologic:  Lethargic, follows simple commands,speech clear  Skin: No acute lesions or rashes  Access: Peritoneal  catheter    Basic Metabolic Panel: Recent Labs  Lab 10/24/20 0522 10/24/20 0522 10/25/20 0542 10/25/20 0542 10/26/20 0005 10/26/20 0005 10/26/20 0529 10/27/20 0500 10/28/20 0450  NA 134*   < > 135  --  135  --  134* 139 136  K 4.4   < > 4.9  --  5.6*  --  5.7* 5.7* 4.6  CL 94*   < > 96*  --  94*  --  93* 96* 97*  CO2 19*   < > 16*  --  16*  --  15* 15* 16*  GLUCOSE 229*   < > 197*  --  210*  --  228* 210* 265*  BUN 152*   < > 187*  --  235*  --  242* 243* 242*  CREATININE 10.93*   < > 11.41*  --  12.00*  --  12.15* 11.09* 10.46*  CALCIUM 7.2*   < > 7.2*   < > 7.3*   < > 7.3* 6.9* 6.5*  MG 1.9  --  1.9  --   --   --  2.2 2.2 2.3   < > = values in this  interval not displayed.    Liver Function Tests: Recent Labs  Lab 10/22/20 1431  AST 52*  ALT 21  ALKPHOS 55  BILITOT 0.7  PROT 5.8*  ALBUMIN 2.7*   No results for input(s): LIPASE, AMYLASE in the last 168 hours. No results for input(s): AMMONIA in the last 168 hours.  CBC: Recent Labs  Lab 10/20/2020 1213 10/23/20 0549 10/25/20 0542 10/26/20 0005 10/26/20 0529 10/27/20 0500 10/28/20 0450  WBC 5.9   < > 19.1* 20.3* 18.4* 23.2* 11.3*  NEUTROABS 4.4  --   --   --   --   --   --   HGB 13.9   < > 13.5 13.0 12.2* 12.6* 10.8*  HCT 40.6   < > 37.1* 36.1* 33.8* 35.4* 31.1*  MCV 93.8   < > 88.8 88.0 88.7 89.2 90.4  PLT 107*   < > 151 148* 139* 167 127*   < > = values in this interval not displayed.    Cardiac Enzymes: No results  for input(s): CKTOTAL, CKMB, CKMBINDEX, TROPONINI in the last 168 hours.  BNP: Invalid input(s): POCBNP  CBG: Recent Labs  Lab 10/27/20 0837 10/27/20 1222 10/27/20 1635 10/27/20 2100 10/28/20 0837  GLUCAP 134* 148* 178* 137* 191*    Microbiology: Results for orders placed or performed during the hospital encounter of 11/08/2020  Respiratory Panel by RT PCR (Flu A&B, Covid) - Nasopharyngeal Swab     Status: Abnormal   Collection Time: 10/23/2020  1:16 PM   Specimen: Nasopharyngeal Swab  Result Value Ref Range Status   SARS Coronavirus 2 by RT PCR POSITIVE (A) NEGATIVE Final    Comment: RESULT CALLED TO, READ BACK BY AND VERIFIED WITH: REID RENO 10/20/2020 1433 KLW (NOTE) SARS-CoV-2 target nucleic acids are DETECTED.  SARS-CoV-2 RNA is generally detectable in upper respiratory specimens  during the acute phase of infection. Positive results are indicative of the presence of the identified virus, but do not rule out bacterial infection or co-infection with other pathogens not detected by the test. Clinical correlation with patient history and other diagnostic information is necessary to determine patient infection status. The expected result  is Negative.  Fact Sheet for Patients:  PinkCheek.be  Fact Sheet for Healthcare Providers: GravelBags.it  This test is not yet approved or cleared by the [place] FDA and  has been authorized for detection and/or diagnosis of SARS-CoV-2 by FDA under an Emergency Use Authorization (EUA).  This EUA will remain in effect (meaning this test can be used) fo r the duration of  the COVID-19 declaration under Section 564(b)(1) of the Act, 21 U.S.C. section 360bbb-3(b)(1), unless the authorization is terminated or revoked sooner.      Influenza A by PCR NEGATIVE NEGATIVE Final   Influenza B by PCR NEGATIVE NEGATIVE Final    Comment: (NOTE) The Xpert Xpress SARS-CoV-2/FLU/RSV assay is intended as an aid in  the diagnosis of influenza from Nasopharyngeal swab specimens and  should not be used as a sole basis for treatment. Nasal washings and  aspirates are unacceptable for Xpert Xpress SARS-CoV-2/FLU/RSV  testing.  Fact Sheet for Patients: PinkCheek.be  Fact Sheet for Healthcare Providers: GravelBags.it  This test is not yet approved or cleared by the [place] FDA and  has been authorized for detection and/or diagnosis of SARS-CoV-2 by  FDA under an Emergency Use Authorization (EUA). This EUA will remain  in effect (meaning this test can be used) for the duration of the  Covid-19 declaration under Section 564(b)(1) of the Act, 21  U.S.C. section 360bbb-3(b)(1), unless the authorization is  terminated or revoked. Performed at [facility], [address] [place], [place] [address]   CULTURE, BLOOD (ROUTINE X 2) w Reflex to ID Panel     Status: None (Preliminary result)   Collection Time: 10/26/20  5:29 AM   Specimen: Left Antecubital; Blood  Result Value Ref Range Status   Specimen Description LEFT ANTECUBITAL  Final   Special Requests   Final     BOTTLES DRAWN AEROBIC AND ANAEROBIC Blood Culture adequate volume   Culture   Final    NO GROWTH 2 DAYS Performed at [facility], [address] [place], [place] [address]    Report Status PENDING  Incomplete  CULTURE, BLOOD (ROUTINE X 2) w Reflex to ID Panel     Status: None (Preliminary result)   Collection Time: 10/26/20  7:19 AM   Specimen: BLOOD  Result Value Ref Range Status   Specimen Description BLOOD RAC  Final  Special Requests   Final    BOTTLES DRAWN AEROBIC AND ANAEROBIC Blood Culture adequate volume   Culture   Final    NO GROWTH 2 DAYS Performed at [facility], [address] [place], [place] [address]    Report Status PENDING  Incomplete    Coagulation Studies: No results for input(s): LABPROT, INR in the last 72 hours.  Urinalysis: No results for input(s): COLORURINE, LABSPEC, PHURINE, GLUCOSEU, HGBUR, BILIRUBINUR, KETONESUR, PROTEINUR, UROBILINOGEN, NITRITE, LEUKOCYTESUR in the last 72 hours.  Invalid input(s): APPERANCEUR    Imaging: No results found.   Medications:    sodium chloride 500 mL (10/26/20 1115)   azithromycin 500 mg (10/28/20 0415)   cefTRIAXone (ROCEPHIN)  IV 2 g (10/28/20 0417)   dialysis solution 1.5% low-MG/low-CA      aspirin  81 mg Oral Daily   atorvastatin  80 mg Oral Daily   calcitRIOL  0.25 mcg Oral Daily   calcium carbonate  1 tablet Oral BID WC   carvedilol  25 mg Oral BID   feeding supplement (NEPRO CARB STEADY)  237 mL Oral BID BM   gentamicin cream  1 application Topical Daily   heparin  5,000 Units Subcutaneous Q8H   insulin aspart  0-15 Units Subcutaneous TID WC   insulin aspart  0-5 Units Subcutaneous QHS   insulin detemir  10 Units Subcutaneous BID   Ipratropium-Albuterol  1 puff Inhalation QID   mouth rinse  15 mL Mouth Rinse BID   methylPREDNISolone (SOLU-MEDROL) injection  60 mg Intravenous BID   [START ON 10/29/2020] multivitamin  1 tablet Oral QHS   torsemide  100 mg Oral Daily      Assessment/ Plan:  Willie Hahn is a 64 y.o.  male  has h/o COPD,CAD and ESRD on peritoneal dialysis.He came to ED with SOB, diagnosed with COVID 19 infection.   # ESRD on peritoneal dialysis  Patient received peritoneal dialysis session last night We will continue PD again tonight  #Secondary hyperparathyroidism Lab Results  Component Value Date   CALCIUM 6.5 (L) 10/28/2020    Continue calcium supplements  #Hypertension BP readings stay slightly above goal   # Covid 19 pneumonia Patient's respiratory status appears improved, compared to yesterday. He still continues to be on supplemental O2 Dyspnea on minimal exertion noted  #Hyperkalemia Patient received Veltassa yesterday, potassium level down to 4.6 today  I saw and evaluated the patient and discussed the care with Crosby Oyster, DNP.  I agree with the findings and plan as documented in the note. Maintain the patient on peritoneal dialysis at this time.  Lorean Ekstrand Holley Raring , [facility] 11/16/20213:19 PM    LOS: 6 Crosby Oyster 11/16/202112:03 PM

## 2020-10-28 NOTE — Plan of Care (Signed)
Pt on 50L heated High flow. Sats 88-93% over night. No acute events over night.  Problem: Education: Goal: Knowledge of risk factors and measures for prevention of condition will improve Outcome: Progressing   Problem: Coping: Goal: Psychosocial and spiritual needs will be supported Outcome: Progressing   Problem: Respiratory: Goal: Will maintain a patent airway Outcome: Progressing Goal: Complications related to the disease process, condition or treatment will be avoided or minimized Outcome: Progressing   Problem: Education: Goal: Knowledge of General Education information will improve Description: Including pain rating scale, medication(s)/side effects and non-pharmacologic comfort measures Outcome: Progressing   Problem: Clinical Measurements: Goal: Ability to maintain clinical measurements within normal limits will improve Outcome: Progressing Goal: Will remain free from infection Outcome: Progressing Goal: Diagnostic test results will improve Outcome: Progressing Goal: Respiratory complications will improve Outcome: Progressing Goal: Cardiovascular complication will be avoided Outcome: Progressing   Problem: Elimination: Goal: Will not experience complications related to bowel motility Outcome: Progressing Goal: Will not experience complications related to urinary retention Outcome: Progressing   Problem: Pain Managment: Goal: General experience of comfort will improve Outcome: Progressing   Problem: Safety: Goal: Ability to remain free from injury will improve Outcome: Progressing   Problem: Skin Integrity: Goal: Risk for impaired skin integrity will decrease Outcome: Progressing

## 2020-10-28 NOTE — Plan of Care (Signed)
  Problem: Coping: Goal: Psychosocial and spiritual needs will be supported Outcome: Progressing   Problem: Education: Goal: Knowledge of General Education information will improve Description: Including pain rating scale, medication(s)/side effects and non-pharmacologic comfort measures Outcome: Progressing   Problem: Clinical Measurements: Goal: Respiratory complications will improve Outcome: Progressing

## 2020-10-28 NOTE — Progress Notes (Addendum)
PROGRESS NOTE    Willie Hahn  YKD:[id number] DOB: Dec 11, 1956 DOA: 10/22/2020 PCP: Birdie Sons, MD    Assessment & Plan:   Principal Problem:   Acute hypoxemic respiratory failure due to COVID-19 [facility]) Active Problems:   Coronary artery disease   Essential hypertension   Insomnia   Compulsive tobacco user syndrome   H/O placement of stent in anterior descending branch of left coronary artery   Mixed hyperlipidemia   CKD (chronic kidney disease) stage V requiring chronic dialysis ([place])   Focal segmental glomerulosclerosis   Pneumonia due to COVID-19 virus    Willie Hahn is a 64 y.o. male with medical history significant for hypertension, hyperlipidemia, gout in remission, end-stage renal disease on peritoneal dialysis presented to the emergency department for chief concerns of shortness of breath x6 days worsening with minimal improvement with home inhaler for his COPD.   Acute hypoxic respiratory failure secondary to COVID-19 PNA, worsening Secondary bacterial PNA -Status post Solu-Medrol 125 mg IV per ED provider --O2 requirement was 6L, had acute worsening of hypoxic respiratory failure overnight 11/13, had to be put on heated hf. --CRP 7.7, trending down initially, now trending back up --procal 2.33, empiric ceftriaxone/azithromycin started. --completed Remdesivir PLAN: --cont solumedrol due to CRP increasing --trend CRP --cont Combivent QID --cont ceftriaxone and azithromycin --Continue supplemental O2 to keep sats between 88-92%, wean as tolerated  CAD -appears stable at this time no clinical indication for ACS --on home atorvastatin 80 mg daily, Coreg 25 mg twice daily, Imdur 30 mg daily, aspirin 81 mg daily PLAN: --cont home coreg --Hold Imdur --cont ASA and statin  COPD  --Continue supplemental O2 to keep sats between 88-92%, wean as tolerated -cont Combivent as QID -Albuterol PRN q4h for shortness of breath and wheezing  Hypertension  -On  home amlodipine 10 mg daily, carvedilol 25 mg bid, hydralazine 50 mg TID, losartan-hctz 100-12.5 daily at home, torsemide --BP starting to trend up PLAN: --cont home coreg and torsemide --resume home Losartan-HCTZ  ESRD on peritoneal dialysis Secondary hyperparathyroidism -Status post 500 cc LR bolus in the ED -Patient endorses compliance with peritoneal dialysis -Nephrology has been consulted -Patient is still urinating PLAN: --cont home torsemide 100 mg daily --cont inpatient PD per nephrology --cont Calcitriol and Ca Carbonate  Hyperkalemia --improved with Veltassa --Monitor   Hyperlipidemia -cont atorvastatin 80 mg daily  Tobacco user -patient not ready to quit  Hyperglycemia, likely due to steroid use Prediabetic --A1c 6.1 PLAN: --cont Levemir 10u BID --SSI  Delirium --ABG today, not hypercabic    DVT prophylaxis: Heparin SQ Code Status: Limited code No intubation, confirmed with daughter. Family Communication:  Status is: inpatient Dispo:   The patient is from: home Anticipated d/c is to: undetermined Anticipated d/c date is: >3 days Patient currently is not medically stable to d/c due to: on heated hf   Subjective and Interval History:  Pt has been subdued, not interacting with staff, not eating.  Sometimes oriented, sometimes confused.  Still pulling off his heated hf intermittently, causing desaturation.   Objective: Vitals:   10/28/20 0521 10/28/20 0840 10/28/20 1046 10/28/20 1339  BP:  (!) 160/73 (!) 159/81   Pulse:  80 75   Resp:  20 20   Temp:  97.9 F (36.6 C) 97.7 F (36.5 C)   TempSrc:   Oral   SpO2:  91% 92% 91%  Weight: 81.9 kg     Height:        Intake/Output Summary (Last 24 hours)  at 10/28/2020 1537 Last data filed at 10/28/2020 0015 Gross per 24 hour  Intake --  Output 450 ml  Net -450 ml   Filed Weights   10/27/20 0110 10/27/20 0346 10/28/20 0521  Weight: 81 kg 81 kg 81.9 kg    Examination:  Constitutional:  NAD, somnolent, but arousble, not interactive HEENT: conjunctivae and lids normal, EOMI CV: No cyanosis.   RESP: no distress, on 50% 50L Extremities: No effusions, edema in BLE SKIN: warm, dry and intact Neuro: II - XII grossly intact.      Data Reviewed: I have personally reviewed following labs and imaging studies  CBC: Recent Labs  Lab 10/25/20 0542 10/26/20 0005 10/26/20 0529 10/27/20 0500 10/28/20 0450  WBC 19.1* 20.3* 18.4* 23.2* 11.3*  HGB 13.5 13.0 12.2* 12.6* 10.8*  HCT 37.1* 36.1* 33.8* 35.4* 31.1*  MCV 88.8 88.0 88.7 89.2 90.4  PLT 151 148* 139* 167 419*   Basic Metabolic Panel: Recent Labs  Lab 10/24/20 0522 10/24/20 0522 10/25/20 0542 10/26/20 0005 10/26/20 0529 10/27/20 0500 10/28/20 0450  NA 134*   < > 135 135 134* 139 136  K 4.4   < > 4.9 5.6* 5.7* 5.7* 4.6  CL 94*   < > 96* 94* 93* 96* 97*  CO2 19*   < > 16* 16* 15* 15* 16*  GLUCOSE 229*   < > 197* 210* 228* 210* 265*  BUN 152*   < > 187* 235* 242* 243* 242*  CREATININE 10.93*   < > 11.41* 12.00* 12.15* 11.09* 10.46*  CALCIUM 7.2*   < > 7.2* 7.3* 7.3* 6.9* 6.5*  MG 1.9  --  1.9  --  2.2 2.2 2.3   < > = values in this interval not displayed.   GFR: Estimated Creatinine Clearance: 7.4 mL/min (A) (by C-G formula based on SCr of 10.46 mg/dL (H)). Liver Function Tests: Recent Labs  Lab 10/22/20 1431  AST 52*  ALT 21  ALKPHOS 55  BILITOT 0.7  PROT 5.8*  ALBUMIN 2.7*   No results for input(s): LIPASE, AMYLASE in the last 168 hours. No results for input(s): AMMONIA in the last 168 hours. Coagulation Profile: No results for input(s): INR, PROTIME in the last 168 hours. Cardiac Enzymes: No results for input(s): CKTOTAL, CKMB, CKMBINDEX, TROPONINI in the last 168 hours. BNP (last 3 results) No results for input(s): PROBNP in the last 8760 hours. HbA1C: No results for input(s): HGBA1C in the last 72 hours. CBG: Recent Labs  Lab 10/27/20 1222 10/27/20 1635 10/27/20 2100 10/28/20 0837  10/28/20 1210  GLUCAP 148* 178* 137* 191* 148*   Lipid Profile: No results for input(s): CHOL, HDL, LDLCALC, TRIG, CHOLHDL, LDLDIRECT in the last 72 hours. Thyroid Function Tests: No results for input(s): TSH, T4TOTAL, FREET4, T3FREE, THYROIDAB in the last 72 hours. Anemia Panel: No results for input(s): VITAMINB12, FOLATE, FERRITIN, TIBC, IRON, RETICCTPCT in the last 72 hours. Sepsis Labs: Recent Labs  Lab 10/26/20 0005 10/26/20 0529 10/27/20 0500 10/28/20 0926  PROCALCITON 2.33 2.28 4.43 3.47  LATICACIDVEN  --  1.2  --   --     Recent Results (from the past 240 hour(s))  Respiratory Panel by RT PCR (Flu A&B, Covid) - Nasopharyngeal Swab     Status: Abnormal   Collection Time: 10/30/2020  1:16 PM   Specimen: Nasopharyngeal Swab  Result Value Ref Range Status   SARS Coronavirus 2 by RT PCR POSITIVE (A) NEGATIVE Final    Comment: RESULT CALLED TO, READ BACK BY  AND VERIFIED WITH: REID RENO 11/05/2020 1433 KLW (NOTE) SARS-CoV-2 target nucleic acids are DETECTED.  SARS-CoV-2 RNA is generally detectable in upper respiratory specimens  during the acute phase of infection. Positive results are indicative of the presence of the identified virus, but do not rule out bacterial infection or co-infection with other pathogens not detected by the test. Clinical correlation with patient history and other diagnostic information is necessary to determine patient infection status. The expected result is Negative.  Fact Sheet for Patients:  PinkCheek.be  Fact Sheet for Healthcare Providers: GravelBags.it  This test is not yet approved or cleared by the [place] FDA and  has been authorized for detection and/or diagnosis of SARS-CoV-2 by FDA under an Emergency Use Authorization (EUA).  This EUA will remain in effect (meaning this test can be used) fo r the duration of  the COVID-19 declaration under Section 564(b)(1) of the Act,  21 U.S.C. section 360bbb-3(b)(1), unless the authorization is terminated or revoked sooner.      Influenza A by PCR NEGATIVE NEGATIVE Final   Influenza B by PCR NEGATIVE NEGATIVE Final    Comment: (NOTE) The Xpert Xpress SARS-CoV-2/FLU/RSV assay is intended as an aid in  the diagnosis of influenza from Nasopharyngeal swab specimens and  should not be used as a sole basis for treatment. Nasal washings and  aspirates are unacceptable for Xpert Xpress SARS-CoV-2/FLU/RSV  testing.  Fact Sheet for Patients: PinkCheek.be  Fact Sheet for Healthcare Providers: GravelBags.it  This test is not yet approved or cleared by the [place] FDA and  has been authorized for detection and/or diagnosis of SARS-CoV-2 by  FDA under an Emergency Use Authorization (EUA). This EUA will remain  in effect (meaning this test can be used) for the duration of the  Covid-19 declaration under Section 564(b)(1) of the Act, 21  U.S.C. section 360bbb-3(b)(1), unless the authorization is  terminated or revoked. Performed at [facility], [address] [place], [place] [address]   CULTURE, BLOOD (ROUTINE X 2) w Reflex to ID Panel     Status: None (Preliminary result)   Collection Time: 10/26/20  5:29 AM   Specimen: Left Antecubital; Blood  Result Value Ref Range Status   Specimen Description LEFT ANTECUBITAL  Final   Special Requests   Final    BOTTLES DRAWN AEROBIC AND ANAEROBIC Blood Culture adequate volume   Culture   Final    NO GROWTH 2 DAYS Performed at [facility], [address] [place], [place] [address]    Report Status PENDING  Incomplete  CULTURE, BLOOD (ROUTINE X 2) w Reflex to ID Panel     Status: None (Preliminary result)   Collection Time: 10/26/20  7:19 AM   Specimen: BLOOD  Result Value Ref Range Status   Specimen Description BLOOD RAC  Final   Special Requests   Final    BOTTLES DRAWN AEROBIC AND  ANAEROBIC Blood Culture adequate volume   Culture   Final    NO GROWTH 2 DAYS Performed at [facility], [address], [place], [place] [address]    Report Status PENDING  Incomplete      Radiology Studies: No results found.   Scheduled Meds: . aspirin  81 mg Oral Daily  . atorvastatin  80 mg Oral Daily  . calcitRIOL  0.25 mcg Oral Daily  . calcium carbonate  1 tablet Oral BID WC  . carvedilol  25 mg Oral BID  . feeding supplement (NEPRO CARB STEADY)  237 mL Oral BID BM  . gentamicin cream  1 application Topical Daily  . heparin  5,000 Units Subcutaneous Q8H  . insulin aspart  0-15 Units Subcutaneous TID WC  . insulin aspart  0-5 Units Subcutaneous QHS  . insulin detemir  10 Units Subcutaneous BID  . Ipratropium-Albuterol  1 puff Inhalation QID  . mouth rinse  15 mL Mouth Rinse BID  . methylPREDNISolone (SOLU-MEDROL) injection  60 mg Intravenous BID  . [START ON 10/29/2020] multivitamin  1 tablet Oral QHS  . torsemide  100 mg Oral Daily   Continuous Infusions: . sodium chloride 500 mL (10/26/20 1115)  . azithromycin Stopped (10/28/20 0515)  . cefTRIAXone (ROCEPHIN)  IV Stopped (10/28/20 0447)  . dialysis solution 1.5% low-MG/low-CA       LOS: 6 days     Enzo Bi, MD [facility] If 7PM-7AM, please contact night-coverage 10/28/2020, 3:37 PM

## 2020-10-28 NOTE — Plan of Care (Signed)
  Problem: Education: Goal: Knowledge of risk factors and measures for prevention of condition will improve Outcome: Not Progressing   Problem: Coping: Goal: Psychosocial and spiritual needs will be supported Outcome: Not Progressing   Problem: Respiratory: Goal: Will maintain a patent airway Outcome: Not Progressing Goal: Complications related to the disease process, condition or treatment will be avoided or minimized Outcome: Not Progressing   Problem: Education: Goal: Knowledge of General Education information will improve Description: Including pain rating scale, medication(s)/side effects and non-pharmacologic comfort measures Outcome: Not Progressing   Problem: Clinical Measurements: Goal: Ability to maintain clinical measurements within normal limits will improve Outcome: Not Progressing Goal: Will remain free from infection Outcome: Not Progressing Goal: Diagnostic test results will improve Outcome: Not Progressing Goal: Respiratory complications will improve Outcome: Not Progressing Goal: Cardiovascular complication will be avoided Outcome: Not Progressing   Problem: Elimination: Goal: Will not experience complications related to bowel motility Outcome: Not Progressing Goal: Will not experience complications related to urinary retention Outcome: Not Progressing   Problem: Pain Managment: Goal: General experience of comfort will improve Outcome: Not Progressing   Problem: Safety: Goal: Ability to remain free from injury will improve Outcome: Not Progressing   Problem: Skin Integrity: Goal: Risk for impaired skin integrity will decrease Outcome: Not Progressing    Patient A/O to person and place, remains with flat affect and not interactive. Stating the he was cold this evening. Room tempeture adjusted and warm blankets provided. Family called and updated this evening. Will continue with the current plan of care.

## 2020-10-29 DIAGNOSIS — J9601 Acute respiratory failure with hypoxia: Secondary | ICD-10-CM | POA: Diagnosis not present

## 2020-10-29 DIAGNOSIS — U071 COVID-19: Secondary | ICD-10-CM | POA: Diagnosis not present

## 2020-10-29 LAB — CBC
HCT: 31.6 % — ABNORMAL LOW (ref 39.0–52.0)
Hemoglobin: 11.4 g/dL — ABNORMAL LOW (ref 13.0–17.0)
MCH: 31.9 pg (ref 26.0–34.0)
MCHC: 36.1 g/dL — ABNORMAL HIGH (ref 30.0–36.0)
MCV: 88.5 fL (ref 80.0–100.0)
Platelets: 143 10*3/uL — ABNORMAL LOW (ref 150–400)
RBC: 3.57 MIL/uL — ABNORMAL LOW (ref 4.22–5.81)
RDW: 12.6 % (ref 11.5–15.5)
WBC: 15.9 10*3/uL — ABNORMAL HIGH (ref 4.0–10.5)
nRBC: 0 % (ref 0.0–0.2)

## 2020-10-29 LAB — BASIC METABOLIC PANEL
Anion gap: 23 — ABNORMAL HIGH (ref 5–15)
BUN: 229 mg/dL — ABNORMAL HIGH (ref 8–23)
CO2: 18 mmol/L — ABNORMAL LOW (ref 22–32)
Calcium: 6.8 mg/dL — ABNORMAL LOW (ref 8.9–10.3)
Chloride: 100 mmol/L (ref 98–111)
Creatinine, Ser: 9.58 mg/dL — ABNORMAL HIGH (ref 0.61–1.24)
GFR, Estimated: 6 mL/min — ABNORMAL LOW (ref >60)
Glucose, Bld: 132 mg/dL — ABNORMAL HIGH (ref 70–99)
Potassium: 4.2 mmol/L (ref 3.5–5.1)
Sodium: 141 mmol/L (ref 135–145)

## 2020-10-29 LAB — GLUCOSE, CAPILLARY
Glucose-Capillary: 108 mg/dL — ABNORMAL HIGH (ref 70–99)
Glucose-Capillary: 128 mg/dL — ABNORMAL HIGH (ref 70–99)
Glucose-Capillary: 114 mg/dL — ABNORMAL HIGH (ref 70–99)
Glucose-Capillary: 162 mg/dL — ABNORMAL HIGH (ref 70–99)

## 2020-10-29 LAB — C-REACTIVE PROTEIN
CRP: 7.3 mg/dL — ABNORMAL HIGH (ref <1.0)
CRP: 4.8 mg/dL — ABNORMAL HIGH (ref <1.0)

## 2020-10-29 LAB — MAGNESIUM: Magnesium: 2.3 mg/dL (ref 1.7–2.4)

## 2020-10-29 MED ORDER — INSULIN DETEMIR 100 UNIT/ML ~~LOC~~ SOLN
5.0000 [IU] | Freq: Two times a day (BID) | SUBCUTANEOUS | Status: DC
  Filled 2020-10-29: qty 0.05

## 2020-10-29 NOTE — Progress Notes (Signed)
Pt has 1:1 safety sitter at bedside

## 2020-10-29 NOTE — Progress Notes (Signed)
Pt bed alarm going off, RN went to check on patient to find him standing at the edge of the bed with covered in urine and feces with his heated high flow off. Pt linens changed, pt cleaned up and returned back to bed. Bed alarm on. Pt is disoriented and slow to respond to commands

## 2020-10-29 NOTE — Progress Notes (Signed)
Placed Patient on PD for  Evening. When Arrived in the room Pt had his High flow mask off and  Had broken the  Mask. Respiratory was paged and fixed Mask. Pt  Stable upon  Dialysis nurse leaving room

## 2020-10-29 NOTE — Progress Notes (Signed)
[facility]  ROUNDING NOTE   Subjective:  Patient continuous to be on supplemental O2 support 15L . He is arousable to call, but stays very lethargic.Oral intake reported as poor.   Objective:  Vital signs in last 24 hours:  Temp:  [97.4 F (36.3 C)-98.9 F (37.2 C)] 98.4 F (36.9 C) (11/17 1119) Pulse Rate:  [70-78] 70 (11/17 1119) Resp:  [18-20] 18 (11/17 1119) BP: (135-164)/(65-82) 140/72 (11/17 1119) SpO2:  [90 %-96 %] 94 % (11/17 1119) FiO2 (%):  [50 %] 50 % (11/17 1119) Weight:  [80.5 kg] 80.5 kg (11/17 0336)  Weight change: -1.387 kg Filed Weights   10/27/20 0346 10/28/20 0521 10/29/20 0336  Weight: 81 kg 81.9 kg 80.5 kg    Intake/Output: I/O last 3 completed shifts: In: 120 [P.O.:120] Out: 450 [Urine:450]   Intake/Output this shift:  No intake/output data recorded.  Physical Exam: General: Lying in bed, with eyes closed,arousable,but lethargic  Head: Oral mucous membranes appears dry  Eyes:  Sclerae and conjunctivae clear  Lungs:   Respirations symmetrical and unlabored,Lungs diminished at the bases  Heart:  Regular rate and rhythm  Abdomen:  Soft, nontender, non distended  Extremities:  No  peripheral edema.  Neurologic:  Able to open eyes to call, and follow simple commands  Skin: No acute lesions or rashes  Access: Peritoneal  catheter    Basic Metabolic Panel: Recent Labs  Lab 10/25/20 0542 10/25/20 0542 10/26/20 0005 10/26/20 0005 10/26/20 0529 10/26/20 0529 10/27/20 0500 10/28/20 0450 10/29/20 0557  NA 135   < > 135  --  134*  --  139 136 141  K 4.9   < > 5.6*  --  5.7*  --  5.7* 4.6 4.2  CL 96*   < > 94*  --  93*  --  96* 97* 100  CO2 16*   < > 16*  --  15*  --  15* 16* 18*  GLUCOSE 197*   < > 210*  --  228*  --  210* 265* 132*  BUN 187*   < > 235*  --  242*  --  243* 242* 229*  CREATININE 11.41*   < > 12.00*  --  12.15*  --  11.09* 10.46* 9.58*  CALCIUM 7.2*   < > 7.3*   < > 7.3*   < > 6.9* 6.5* 6.8*  MG 1.9  --   --   --   2.2  --  2.2 2.3 2.3   < > = values in this interval not displayed.    Liver Function Tests: Recent Labs  Lab 10/22/20 1431  AST 52*  ALT 21  ALKPHOS 55  BILITOT 0.7  PROT 5.8*  ALBUMIN 2.7*   No results for input(s): LIPASE, AMYLASE in the last 168 hours. No results for input(s): AMMONIA in the last 168 hours.  CBC: Recent Labs  Lab 10/26/20 0005 10/26/20 0529 10/27/20 0500 10/28/20 0450 10/29/20 0557  WBC 20.3* 18.4* 23.2* 11.3* 15.9*  HGB 13.0 12.2* 12.6* 10.8* 11.4*  HCT 36.1* 33.8* 35.4* 31.1* 31.6*  MCV 88.0 88.7 89.2 90.4 88.5  PLT 148* 139* 167 127* 143*    Cardiac Enzymes: No results for input(s): CKTOTAL, CKMB, CKMBINDEX, TROPONINI in the last 168 hours.  BNP: Invalid input(s): POCBNP  CBG: Recent Labs  Lab 10/28/20 1210 10/28/20 1703 10/28/20 2020 10/29/20 0843 10/29/20 1120  GLUCAP 148* 138* 165* 108* 128*    Microbiology: Results for orders placed or performed during  the hospital encounter of 10/28/2020  Respiratory Panel by RT PCR (Flu A&B, Covid) - Nasopharyngeal Swab     Status: Abnormal   Collection Time: 10/30/2020  1:16 PM   Specimen: Nasopharyngeal Swab  Result Value Ref Range Status   SARS Coronavirus 2 by RT PCR POSITIVE (A) NEGATIVE Final    Comment: RESULT CALLED TO, READ BACK BY AND VERIFIED WITH: REID RENO 10/15/2020 1433 KLW (NOTE) SARS-CoV-2 target nucleic acids are DETECTED.  SARS-CoV-2 RNA is generally detectable in upper respiratory specimens  during the acute phase of infection. Positive results are indicative of the presence of the identified virus, but do not rule out bacterial infection or co-infection with other pathogens not detected by the test. Clinical correlation with patient history and other diagnostic information is necessary to determine patient infection status. The expected result is Negative.  Fact Sheet for Patients:  PinkCheek.be  Fact Sheet for Healthcare  Providers: GravelBags.it  This test is not yet approved or cleared by the [place] FDA and  has been authorized for detection and/or diagnosis of SARS-CoV-2 by FDA under an Emergency Use Authorization (EUA).  This EUA will remain in effect (meaning this test can be used) fo r the duration of  the COVID-19 declaration under Section 564(b)(1) of the Act, 21 U.S.C. section 360bbb-3(b)(1), unless the authorization is terminated or revoked sooner.      Influenza A by PCR NEGATIVE NEGATIVE Final   Influenza B by PCR NEGATIVE NEGATIVE Final    Comment: (NOTE) The Xpert Xpress SARS-CoV-2/FLU/RSV assay is intended as an aid in  the diagnosis of influenza from Nasopharyngeal swab specimens and  should not be used as a sole basis for treatment. Nasal washings and  aspirates are unacceptable for Xpert Xpress SARS-CoV-2/FLU/RSV  testing.  Fact Sheet for Patients: PinkCheek.be  Fact Sheet for Healthcare Providers: GravelBags.it  This test is not yet approved or cleared by the [place] FDA and  has been authorized for detection and/or diagnosis of SARS-CoV-2 by  FDA under an Emergency Use Authorization (EUA). This EUA will remain  in effect (meaning this test can be used) for the duration of the  Covid-19 declaration under Section 564(b)(1) of the Act, 21  U.S.C. section 360bbb-3(b)(1), unless the authorization is  terminated or revoked. Performed at [facility], [address] [place], [place] [address]   CULTURE, BLOOD (ROUTINE X 2) w Reflex to ID Panel     Status: None (Preliminary result)   Collection Time: 10/26/20  5:29 AM   Specimen: Left Antecubital; Blood  Result Value Ref Range Status   Specimen Description LEFT ANTECUBITAL  Final   Special Requests   Final    BOTTLES DRAWN AEROBIC AND ANAEROBIC Blood Culture adequate volume   Culture   Final    NO GROWTH 3  DAYS Performed at [facility], [address] [place], [place] [address]    Report Status PENDING  Incomplete  CULTURE, BLOOD (ROUTINE X 2) w Reflex to ID Panel     Status: None (Preliminary result)   Collection Time: 10/26/20  7:19 AM   Specimen: BLOOD  Result Value Ref Range Status   Specimen Description BLOOD RAC  Final   Special Requests   Final    BOTTLES DRAWN AEROBIC AND ANAEROBIC Blood Culture adequate volume   Culture   Final    NO GROWTH 3 DAYS Performed at [facility], [address] [place], [place] [address]    Report Status PENDING  Incomplete    Coagulation Studies: No results for input(s): LABPROT, INR in the last 72 hours.  Urinalysis: No results for input(s): COLORURINE, LABSPEC, PHURINE, GLUCOSEU, HGBUR, BILIRUBINUR, KETONESUR, PROTEINUR, UROBILINOGEN, NITRITE, LEUKOCYTESUR in the last 72 hours.  Invalid input(s): APPERANCEUR    Imaging: No results found.   Medications:   . sodium chloride 500 mL (10/26/20 1115)  . azithromycin 500 mg (10/29/20 0524)  . cefTRIAXone (ROCEPHIN)  IV 2 g (10/29/20 0518)  . dialysis solution 1.5% low-MG/low-CA     . aspirin  81 mg Oral Daily  . atorvastatin  80 mg Oral Daily  . calcitRIOL  0.25 mcg Oral Daily  . calcium carbonate  1 tablet Oral BID WC  . carvedilol  25 mg Oral BID  . feeding supplement (NEPRO CARB STEADY)  237 mL Oral BID BM  . gentamicin cream  1 application Topical Daily  . heparin  5,000 Units Subcutaneous Q8H  . losartan  100 mg Oral Daily   And  . hydrochlorothiazide  12.5 mg Oral Daily  . insulin aspart  0-15 Units Subcutaneous TID WC  . insulin aspart  0-5 Units Subcutaneous QHS  . insulin detemir  10 Units Subcutaneous BID  . Ipratropium-Albuterol  1 puff Inhalation QID  . mouth rinse  15 mL Mouth Rinse BID  . methylPREDNISolone (SOLU-MEDROL) injection  60 mg Intravenous BID  . multivitamin  1 tablet Oral QHS  . torsemide  100 mg Oral Daily     Assessment/ Plan:   [name] is a 64 y.o.  male  has h/o COPD,CAD and ESRD on peritoneal dialysis.He came to ED with SOB, diagnosed with COVID 19 infection.   # ESRD on peritoneal dialysis  Patient continues to receive PD treatments during the night, tolerating well No malfunctioning reported We will continue peritoneal dialysis treatments nightly  #Secondary hyperparathyroidism Lab Results  Component Value Date   CALCIUM 6.8 (L) 10/29/2020  Will continue Calcitriol and Calcium Carbonate  #Hypertension Controlled with Carvedilol, hydrochlorothiazide and Torsemide  # Covid 19 pneumonia Patient is on steroids, Azithromycin and Ceftriaxone Continuous to require 15L of supplemental O2 Bronchodilators as needed  #Hyperkalemia Potassium 4.2 today Will continue monitoring closely  #Diabetes Type ii with CKD  Blood Glucose readings within acceptable range Will continue Insulin Aspart and Insulin Detemir  Crosby Oyster , MD [facility] 11/17/202112:05 PM    LOS: 7 Somaly Marteney 11/17/202112:05 PM

## 2020-10-29 NOTE — Progress Notes (Signed)
[place] [facility] [place] NAME: Willie Hahn    MR#:  [id number]  DATE OF BIRTH:  1956-07-30  SUBJECTIVE:   Patient very sleepy response to verbal command but falls off to sleep very easily. Tells me he does not know where he is. Remembers her daughter's name. Not able to take oral meds this morning due to lethargy. Sitter was placed initially in the morning since patient was removing oxygen was confused and urinating and fecaes on the floor per RN. REVIEW OF SYSTEMS:   Review of Systems  Unable to perform ROS: Mental status change   Tolerating Diet: Tolerating PT:   DRUG ALLERGIES:   Allergies  Allergen Reactions  . Ambien [Zolpidem Tartrate] Other (See Comments)    Headaches...migraines    VITALS:  Blood pressure 140/72, pulse 70, temperature 98.4 F (36.9 C), resp. rate 18, height 5\' 6"  (1.676 m), weight 80.5 kg, SpO2 94 %.  PHYSICAL EXAMINATION:   Physical Examlimited due to patient participation  GENERAL:  64 y.o.-year-old patient lying in the bed with no acute distress. disheveled HEENT: Head atraumatic, normocephalic. Oropharynx and nasopharynx clear.  LUNGS: Normal breath sounds bilaterally, no wheezing, rales, rhonchi. No use of accessory muscles of respiration.  CARDIOVASCULAR: S1, S2 normal. No murmurs, rubs, or gallops.  ABDOMEN: Soft, nontender, nondistended. Bowel sounds present. No organomegaly or mass. PD catheter present EXTREMITIES: No cyanosis, clubbing or edema b/l.    NEUROLOGIC: grossly nonfocal. Moves all extremities well.  PSYCHIATRIC:  patient is lethargic. Response to verbal command falls back to sleep SKIN: No obvious rash, lesion, or ulcer.  P LABORATORY PANEL:  CBC Recent Labs  Lab 10/29/20 0557  WBC 15.9*  HGB 11.4*  HCT 31.6*  PLT 143*    Chemistries  Recent Labs  Lab 10/22/20 1431 10/23/20 0549 10/29/20 0557  NA  --    < > 141  K  --    < > 4.2  CL  --    < > 100  CO2  --    < > 18*   GLUCOSE  --    < > 132*  BUN  --    < > 229*  CREATININE  --    < > 9.58*  CALCIUM  --    < > 6.8*  MG  --    < > 2.3  AST 52*  --   --   ALT 21  --   --   ALKPHOS 55  --   --   BILITOT 0.7  --   --    < > = values in this interval not displayed.   Cardiac Enzymes No results for input(s): TROPONINI in the last 168 hours. RADIOLOGY:  No results found. ASSESSMENT AND PLAN:   Willie Hahn Smithis a 64 y.o.malewith medical history significant forhypertension, hyperlipidemia, gout in remission, end-stage renal disease on peritoneal dialysis presented to the emergency department for chief concerns ofshortness of breath x6 days worsening with minimal improvement with home inhaler for his COPD.  Acute metabolic encephalopathy suspect due to hypoxia and COVID infection -patienter removes oxygen becomes hypoxic and tries to get out of bed. -During my evaluation became lethargic although did open eyes to verbal commands and answered some questions appropriately. -One-on-one sitter requested for safety since patient keeps removing oxygen. -Avoid sedating meds   Acute hypoxic respiratory failure secondary to COVID-19 PNA, worsening Secondary bacterial PNA -Status post Solu-Medrol 125 mg IV per ED provider --O2  requirement was 6L, had acute worsening of hypoxic respiratory failure overnight 11/13, had to be put on heated hf. --CRP 7.7--now down to 4.8 --procal 2.33, empiric ceftriaxone/azithromycin started. --completed Remdesivir --cont solumedrol due to CRP increasing --trend CRP --cont Combivent QID --cont ceftriaxone and azithromycin --Continue supplemental O2 to keep sats between 88-92%, wean as tolerated  CAD -appears stable at this time no clinical indication for ACS --on home atorvastatin 80 mg daily, Coreg 25 mg twice daily, Imdur 30 mg daily, aspirin 81 mg daily --cont home coreg --Hold Imdur --cont ASA and statin  COPD  --Continue supplemental O2 to keep sats between  88-92%, wean as tolerated -cont Combivent as QID -Albuterol PRN q4h for shortness of breath and wheezing  Hypertension  -Onhomeamlodipine 10 mg daily, carvedilol 25 mg bid, hydralazine 50 mg TID, losartan-hctz 100-12.5 dailyat home, torsemide --BP starting to trend up --cont home coreg and torsemide --resume home Losartan-HCTZ  ESRD on peritoneal dialysis Secondary hyperparathyroidism -Patient endorses compliancewith peritoneal dialysis -Nephrology input appreciated --cont home torsemide 100 mg daily --cont inpatient PD per nephrology --cont Calcitriol and Ca Carbonate  Hyperkalemia --improved with Veltassa --Monitor   Hyperlipidemia -cont atorvastatin 80 mg daily  Tobacco user -patient not ready to quit  Hyperglycemia, likely due to steroid use Prediabetic --A1c 6.1 -- hold Levemir today since patient has not eaten much --continue SSI     DVT prophylaxis: Heparin SQ Code Status: Limited code No intubation, confirmed with daughter by Dr Billie Ruddy  Family Communication: 11/17-- left message for [name] daughter to call me back Status is: inpatient Dispo:   The patient is from: home Anticipated d/c is to: undetermined Anticipated d/c date is: >3 days Patient currently is not medically stable to d/c due to: patient having confusion and currently requiring high flow nasal cannula oxygen for COVID infection/acute hypoxic respiratory failure         TOTAL TIME TAKING CARE OF THIS PATIENT: *25* minutes.  >50% time spent on counselling and coordination of care  Note: This dictation was prepared with Dragon dictation along with smaller phrase technology. Any transcriptional errors that result from this process are unintentional.  Willie Hahn M.D    [facility]   CC: Primary care physician; Willie Hahn, MDPatient ID: Willie Hahn, male   DOB: 09-16-1956, 64 y.o.   MRN: [id number]

## 2020-10-30 DIAGNOSIS — U071 COVID-19: Secondary | ICD-10-CM | POA: Diagnosis not present

## 2020-10-30 DIAGNOSIS — Z66 Do not resuscitate: Secondary | ICD-10-CM

## 2020-10-30 DIAGNOSIS — Z515 Encounter for palliative care: Secondary | ICD-10-CM | POA: Diagnosis not present

## 2020-10-30 DIAGNOSIS — Z7189 Other specified counseling: Secondary | ICD-10-CM | POA: Diagnosis not present

## 2020-10-30 DIAGNOSIS — J9601 Acute respiratory failure with hypoxia: Secondary | ICD-10-CM | POA: Diagnosis not present

## 2020-10-30 LAB — MAGNESIUM: Magnesium: 2.8 mg/dL — ABNORMAL HIGH (ref 1.7–2.4)

## 2020-10-30 LAB — CBC
HCT: 32.4 % — ABNORMAL LOW (ref 39.0–52.0)
Hemoglobin: 11.5 g/dL — ABNORMAL LOW (ref 13.0–17.0)
MCH: 32.3 pg (ref 26.0–34.0)
MCHC: 35.5 g/dL (ref 30.0–36.0)
MCV: 91 fL (ref 80.0–100.0)
Platelets: 139 10*3/uL — ABNORMAL LOW (ref 150–400)
RBC: 3.56 MIL/uL — ABNORMAL LOW (ref 4.22–5.81)
RDW: 13.1 % (ref 11.5–15.5)
WBC: 16.1 10*3/uL — ABNORMAL HIGH (ref 4.0–10.5)
nRBC: 0 % (ref 0.0–0.2)

## 2020-10-30 LAB — GLUCOSE, CAPILLARY
Glucose-Capillary: 227 mg/dL — ABNORMAL HIGH (ref 70–99)
Glucose-Capillary: 192 mg/dL — ABNORMAL HIGH (ref 70–99)
Glucose-Capillary: 160 mg/dL — ABNORMAL HIGH (ref 70–99)
Glucose-Capillary: 277 mg/dL — ABNORMAL HIGH (ref 70–99)

## 2020-10-30 LAB — BASIC METABOLIC PANEL
Anion gap: 27 — ABNORMAL HIGH (ref 5–15)
BUN: 242 mg/dL — ABNORMAL HIGH (ref 8–23)
CO2: 19 mmol/L — ABNORMAL LOW (ref 22–32)
Calcium: 7.2 mg/dL — ABNORMAL LOW (ref 8.9–10.3)
Chloride: 98 mmol/L (ref 98–111)
Creatinine, Ser: 9.82 mg/dL — ABNORMAL HIGH (ref 0.61–1.24)
GFR, Estimated: 5 mL/min — ABNORMAL LOW (ref >60)
Glucose, Bld: 244 mg/dL — ABNORMAL HIGH (ref 70–99)
Potassium: 4.4 mmol/L (ref 3.5–5.1)
Sodium: 144 mmol/L (ref 135–145)

## 2020-10-30 LAB — C-REACTIVE PROTEIN: CRP: 3.3 mg/dL — ABNORMAL HIGH (ref <1.0)

## 2020-10-30 MED ORDER — ADULT MULTIVITAMIN W/MINERALS CH
1.0000 | ORAL_TABLET | Freq: Every day | ORAL | Status: DC
  Filled 2020-10-30: qty 1

## 2020-10-30 MED ORDER — SODIUM CHLORIDE 0.9 % IV SOLN
500.0000 mg | INTRAVENOUS | Status: DC
  Administered 2020-10-31: 500 mg via INTRAVENOUS
  Filled 2020-10-30 (×2): qty 500

## 2020-10-30 MED ORDER — SODIUM CHLORIDE 0.9 % IV SOLN
2.0000 g | INTRAVENOUS | Status: DC
  Administered 2020-10-31: 2 g via INTRAVENOUS
  Filled 2020-10-30 (×2): qty 20

## 2020-10-30 MED ORDER — METHYLPREDNISOLONE SODIUM SUCC 125 MG IJ SOLR
60.0000 mg | Freq: Every day | INTRAMUSCULAR | Status: DC
  Administered 2020-10-31: 60 mg via INTRAVENOUS
  Filled 2020-10-30: qty 2

## 2020-10-30 MED ORDER — DEXTROSE-NACL 5-0.9 % IV SOLN: INTRAVENOUS | Status: DC

## 2020-10-30 NOTE — Progress Notes (Signed)
Pt floor nurse called due to machine alarms for low drain volume.  PD Cath adjustments made and machine restarted. Will check  Back before the end of the shift. Pt may be dehydrated. Dr. Holley Raring notified

## 2020-10-30 NOTE — Progress Notes (Signed)
[place] at [employer] NAME: Willie Hahn    MR#:  [id number]  DATE OF BIRTH:  05-May-1956  SUBJECTIVE:   Patient very sleepy response to verbal command but falls off to sleep very easily.  Patient last night got agitated and broke turn on rebreather mass. Currently his sitting up in bed. Sitter in the room. Ice close. Does not answer most questions. Gets agitated if trying to awaken him. Earlier spit out meds. Not eating. REVIEW OF SYSTEMS:   Review of Systems  Unable to perform ROS: Mental status change   Tolerating Diet:not eating Tolerating PT: unable to participate secondary to change in mental status  DRUG ALLERGIES:   Allergies  Allergen Reactions  . Ambien [Zolpidem Tartrate] Other (See Comments)    Headaches...migraines    VITALS:  Blood pressure (!) 150/79, pulse 61, temperature 98 F (36.7 C), resp. rate 16, height 5\' 6"  (1.676 m), weight 80.5 kg, SpO2 100 %.  PHYSICAL EXAMINATION:   Physical Examlimited due to patient participation  GENERAL:  64 y.o.-year-old patient lying in the bed with no acute distress. disheveled HEENT: Head atraumatic, normocephalic. Oropharynx and nasopharynx clear.  LUNGS: Normal breath sounds bilaterally, no wheezing, rales, rhonchi. No use of accessory muscles of respiration.  CARDIOVASCULAR: S1, S2 normal. No murmurs, rubs, or gallops.  ABDOMEN: Soft, nontender, nondistended. Bowel sounds present. No organomegaly or mass. PD catheter present EXTREMITIES: No cyanosis, clubbing or edema b/l.    NEUROLOGIC: grossly nonfocal admitted exam secondary to patient participation PSYCHIATRIC:  patient is lethargic. Response to verbal command falls back to sleep SKIN: No obvious rash, lesion, or ulcer.  LABORATORY PANEL:  CBC Recent Labs  Lab 10/30/20 0708  WBC 16.1*  HGB 11.5*  HCT 32.4*  PLT 139*    Chemistries  Recent Labs  Lab 10/30/20 0708  NA 144  K 4.4  CL 98  CO2 19*  GLUCOSE 244*   BUN 242*  CREATININE 9.82*  CALCIUM 7.2*  MG 2.8*   Cardiac Enzymes No results for input(s): TROPONINI in the last 168 hours. RADIOLOGY:  No results found. 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  Acute metabolic encephalopathy suspect due to hypoxia and COVID infection -patienter removes oxygen becomes hypoxic and tries to get out of bed. -During my evaluation became lethargic although did open eyes to verbal commands and answered some questions appropriately. Not taking PO meds or eating per RN. -One-on-one sitter requested for safety since patient keeps removing oxygen. -Avoid sedating meds   Acute hypoxic respiratory failure secondary to COVID-19 PNA, worsening Secondary bacterial PNA -Status post Solu-Medrol 125 mg IV per ED provider --O2 requirement was 6L, had acute worsening of hypoxic respiratory failure overnight 11/13, had to be put on heated hf. --CRP 7.7--now down to 4.8 --procal 2.33, empiric ceftriaxone/azithromycin started. --completed Remdesivir -- patient received high dose of Solu-Medrol. CRP down to 4.8. Will change to PO Decadron in 1-2 days --cont Combivent QID --cont ceftriaxone and azithromycin (day 5/7) --Continue supplemental O2 to keep sats between 88-92%, wean as tolerated  CAD -appears stable at this time no clinical indication for ACS --on home atorvastatin 80 mg daily, Coreg 25 mg twice daily, Imdur 30 mg daily, aspirin 81 mg daily --  cont home coreg --Hold Imdur --cont ASA and statin  COPD  --Continue supplemental O2 to keep sats between 88-92%, wean as tolerated -cont Combivent as QID -Albuterol PRN q4h for shortness of breath and  wheezing  Hypertension  -Onhomeamlodipine 10 mg daily, carvedilol 25 mg bid, hydralazine 50 mg TID, losartan-hctz 100-12.5 dailyat home, torsemide --BP starting to trend up --cont home coreg and torsemide --resume home Losartan-HCTZ  ESRD on peritoneal dialysis Secondary hyperparathyroidism -Patient endorses compliancewith peritoneal dialysis -Nephrology input appreciated --cont home torsemide 100 mg daily --cont inpatient PD per nephrology --cont Calcitriol and Ca Carbonate  Hyperkalemia --improved with Veltassa --Monitor   Hyperlipidemia -cont atorvastatin 80 mg daily  Tobacco user -patient not ready to quit  Hyperglycemia, likely due to steroid use Prediabetic --A1c 6.1 -- hold Levemir today since patient has not eaten much --continue SSI  failure to thrive/malnutrition/poor PO intake with change in mentation -- will consider CT head -- dietitian to see patient -- overall patient seems to be not improving much. Has multiple comorbidities. Palliative care consultation placed for goals of care. -- Will need to address feeding/nutrition with daughter. I left a message for [employer].       DVT prophylaxis: Heparin SQ Code Status: Limited code No intubation, confirmed with daughter by Dr Billie Ruddy  Family Communication: 11/18-- left message for Dimetrius Montfort daughter to call me back Status is: inpatient Dispo:   The patient is from: home Anticipated d/c is to: undetermined Anticipated d/c date is: >3 days Patient currently is not medically stable to d/c due to: patient having confusion and currently requiring high flow nasal cannula oxygen for COVID infection/acute hypoxic respiratory failure  Patient has failure to thrive. Poor PO intake. Remains lethargic. Palliative care consultation placed.  TOTAL TIME TAKING CARE OF THIS PATIENT: *25* minutes.  >50% time spent on counselling and coordination of care  Note: This dictation was prepared with  Dragon dictation along with smaller phrase technology. Any transcriptional errors that result from this process are unintentional.  Fritzi Mandes M.D    [facility]   CC: Primary care physician; Birdie Sons, MDPatient ID: Willie Hahn, male   DOB: 07-11-1956, 64 y.o.   MRN: [id number]

## 2020-10-30 NOTE — Progress Notes (Signed)
Initial Nutrition Assessment  DOCUMENTATION CODES:   Severe malnutrition in context of chronic illness  INTERVENTION:   If nasogastric tube placed, recommend:  Osmolite 1.5 $RemoveBefo'@60ml'ZTerkRhOmla$ /hr- Initiate at 43ml/hr and increase by 10ml/hr q 8 hours until goal rate is reached.   Pro-Source 34ml BID via tube, provides 40kcal and 11g of protein per serving   Free water flushes 80ml q4 hours to maintain tube patency   Regimen provides 2240kcal/day, 112g/day protein and 1285ml/day free water   Nepro Shake po TID, each supplement provides 425 kcal and 19 grams protein  Rena-vit daily   MVI daily  Liberalize diet   NUTRITION DIAGNOSIS:   Severe Malnutrition related to chronic illness (COPD, ESRD on PD) as evidenced by increased moderate fat depletion, severe fat depletion, moderate muscle depletion, severe muscle depletion.  GOAL:   Patient will meet greater than or equal to 90% of their needs  MONITOR:   PO intake, Supplement acceptance, Labs, Weight trends, I & O's, Skin  REASON FOR ASSESSMENT:   Malnutrition Screening Tool    ASSESSMENT:   64 y.o.  male  has h/o COPD, CAD, HTN and ESRD on peritoneal dialysis who is admitted with COVID 19 infection.  Met with pt in room today. Pt is unable to provide any nutrition related history as pt with AMS. Pt lying in bed with sitter at bedside. Sitter reports that she has been unable to get pt to eat anything today. Per chart, meal intakes are documented at 0%. Spoke with MD who is concerned about pt's poor oral intake since admit. Per chart, pt also reported poor po for 6 days pta. Pt is ordered for Nepro supplements but per sitter, pt is refusing to drink them. RD will liberalize pt's diet to try and encourage po intake. RD will also add daily MVI. Would recommend placement of nasogastric feeding tube and tube feeds to help pt meet his estimated needs; this was discussed with MD. Pt is at high refeed risk. Per chart, pt appears fairly weight  stable at baseline but appears to be down 9lbs(5%) from his UBW currently. RD is unsure how recently weight loss occurred. Per chart, pt appears fairly weight stable since admit.   Medications reviewed and include: aspirin, calcitriol, heparin, insulin, solu-medrol, rena-vit, torsemide, azithromycin, ceftriaxone, NaCl w/ 5% dextrose $RemoveBef'@30ml'acwCRVHjtb$ /hr  Labs reviewed: Na 144 wnl, K 4.4 wnl, BUN 242(H), creat 9.82(H), Mg 2.8(H) Wbc- 16.1(H) cbgs- 227, 192 x 24 hrs AIC 6.1(H)- 11/11  NUTRITION - FOCUSED PHYSICAL EXAM:    Most Recent Value  Upper Arm Region Severe depletion  Thoracic and Lumbar Region No depletion  Buccal Region Mild depletion  Temple Region Mild depletion  Clavicle Bone Region Moderate depletion  Clavicle and Acromion Bone Region Moderate depletion  Scapular Bone Region Moderate depletion  Dorsal Hand Severe depletion  Patellar Region Severe depletion  Anterior Thigh Region Severe depletion  Posterior Calf Region Severe depletion  Edema (RD Assessment) Mild  Hair Reviewed  Eyes Reviewed  Mouth Reviewed  Skin Reviewed  Nails Reviewed     Diet Order:   Diet Order            Diet regular Room service appropriate? No; Fluid consistency: Thin; Fluid restriction: 1200 mL Fluid  Diet effective now                EDUCATION NEEDS:   Not appropriate for education at this time  Skin:  Skin Assessment: Reviewed RN Assessment (ecchymosis)  Last BM:  11/17- type  7  Height:   Ht Readings from Last 1 Encounters:  10/17/2020 $RemoveB'5\' 6"'[id number]$  (1.676 m)    Weight:   Wt Readings from Last 1 Encounters:  10/29/20 80.5 kg    Ideal Body Weight:  64.5 kg  BMI:  Body mass index is 28.65 kg/m.  Estimated Nutritional Needs:   Kcal:  2000-2300kcal/day  Protein:  100-115g/day  Fluid:  UOP +1L  Koleen Distance MS, RD, LDN Please refer to [facility] for RD and/or RD on-call/weekend/after hours pager

## 2020-10-30 NOTE — Progress Notes (Signed)
Pt Placed on PD at this time.

## 2020-10-30 NOTE — Progress Notes (Signed)
[facility]  ROUNDING NOTE   Subjective:  Patient lying in bed, getting attended by the nursing staff.He appears more lethargic, and disoriented today,unable to follow simple commands. According to nursing staff, he is not eating or drinking.   Objective:  Vital signs in last 24 hours:  Temp:  [97.7 F (36.5 C)-98.3 F (36.8 C)] 98 F (36.7 C) (11/18 1126) Pulse Rate:  [61-87] 61 (11/18 1126) Resp:  [16-20] 16 (11/18 1126) BP: (130-152)/(74-81) 150/79 (11/18 1126) SpO2:  [90 %-100 %] 100 % (11/18 1126) FiO2 (%):  [50 %] 50 % (11/17 2326)  Weight change:  Filed Weights   10/27/20 0346 10/28/20 0521 10/29/20 0336  Weight: 81 kg 81.9 kg 80.5 kg    Intake/Output: I/O last 3 completed shifts: In: 1030.2 [I.V.:211.7; IV Piggyback:818.6] Out: 0    Intake/Output this shift:  No intake/output data recorded.  Physical Exam: General: Awake, lethargic  Head: Oral mucous membranes appears dry  Eyes:  Sclerae and conjunctivae clear  Lungs:   Lungs diminished at the bases,respirations labored, accessory muscle use +  Heart:  S1S2, no rubs or gallops  Abdomen:  Soft, nontender, non distended  Extremities:  No  peripheral edema.  Neurologic:  Responds to call by trying to open his eyes but not following commands  Skin: No acute lesions or rashes  Access: Peritoneal  catheter    Basic Metabolic Panel: Recent Labs  Lab 10/26/20 0529 10/26/20 0529 10/27/20 0500 10/27/20 0500 10/28/20 0450 10/29/20 0557 10/30/20 0708  NA 134*  --  139  --  136 141 144  K 5.7*  --  5.7*  --  4.6 4.2 4.4  CL 93*  --  96*  --  97* 100 98  CO2 15*  --  15*  --  16* 18* 19*  GLUCOSE 228*  --  210*  --  265* 132* 244*  BUN 242*  --  243*  --  242* 229* 242*  CREATININE 12.15*  --  11.09*  --  10.46* 9.58* 9.82*  CALCIUM 7.3*   < > 6.9*   < > 6.5* 6.8* 7.2*  MG 2.2  --  2.2  --  2.3 2.3 2.8*   < > = values in this interval not displayed.    Liver Function Tests: No results for  input(s): AST, ALT, ALKPHOS, BILITOT, PROT, ALBUMIN in the last 168 hours. No results for input(s): LIPASE, AMYLASE in the last 168 hours. No results for input(s): AMMONIA in the last 168 hours.  CBC: Recent Labs  Lab 10/26/20 0529 10/27/20 0500 10/28/20 0450 10/29/20 0557 10/30/20 0708  WBC 18.4* 23.2* 11.3* 15.9* 16.1*  HGB 12.2* 12.6* 10.8* 11.4* 11.5*  HCT 33.8* 35.4* 31.1* 31.6* 32.4*  MCV 88.7 89.2 90.4 88.5 91.0  PLT 139* 167 127* 143* 139*    Cardiac Enzymes: No results for input(s): CKTOTAL, CKMB, CKMBINDEX, TROPONINI in the last 168 hours.  BNP: Invalid input(s): POCBNP  CBG: Recent Labs  Lab 10/29/20 1120 10/29/20 1704 10/29/20 2034 10/30/20 0821 10/30/20 1148  GLUCAP 128* 114* 162* 227* 192*    Microbiology: Results for orders placed or performed during the hospital encounter of 10/27/2020  Respiratory Panel by RT PCR (Flu A&B, Covid) - Nasopharyngeal Swab     Status: Abnormal   Collection Time: 11/05/2020  1:16 PM   Specimen: Nasopharyngeal Swab  Result Value Ref Range Status   SARS Coronavirus 2 by RT PCR POSITIVE (A) NEGATIVE Final    Comment: RESULT CALLED  TO, READ BACK BY AND VERIFIED WITH: REID RENO 11/04/2020 1433 KLW (NOTE) SARS-CoV-2 target nucleic acids are DETECTED.  SARS-CoV-2 RNA is generally detectable in upper respiratory specimens  during the acute phase of infection. Positive results are indicative of the presence of the identified virus, but do not rule out bacterial infection or co-infection with other pathogens not detected by the test. Clinical correlation with patient history and other diagnostic information is necessary to determine patient infection status. The expected result is Negative.  Fact Sheet for Patients:  PinkCheek.be  Fact Sheet for Healthcare Providers: GravelBags.it  This test is not yet approved or cleared by the [place] FDA and  has been authorized  for detection and/or diagnosis of SARS-CoV-2 by FDA under an Emergency Use Authorization (EUA).  This EUA will remain in effect (meaning this test can be used) fo r the duration of  the COVID-19 declaration under Section 564(b)(1) of the Act, 21 U.S.C. section 360bbb-3(b)(1), unless the authorization is terminated or revoked sooner.      Influenza A by PCR NEGATIVE NEGATIVE Final   Influenza B by PCR NEGATIVE NEGATIVE Final    Comment: (NOTE) The Xpert Xpress SARS-CoV-2/FLU/RSV assay is intended as an aid in  the diagnosis of influenza from Nasopharyngeal swab specimens and  should not be used as a sole basis for treatment. Nasal washings and  aspirates are unacceptable for Xpert Xpress SARS-CoV-2/FLU/RSV  testing.  Fact Sheet for Patients: PinkCheek.be  Fact Sheet for Healthcare Providers: GravelBags.it  This test is not yet approved or cleared by the [place] FDA and  has been authorized for detection and/or diagnosis of SARS-CoV-2 by  FDA under an Emergency Use Authorization (EUA). This EUA will remain  in effect (meaning this test can be used) for the duration of the  Covid-19 declaration under Section 564(b)(1) of the Act, 21  U.S.C. section 360bbb-3(b)(1), unless the authorization is  terminated or revoked. Performed at [facility], [address] [place], [place] [address]   CULTURE, BLOOD (ROUTINE X 2) w Reflex to ID Panel     Status: None (Preliminary result)   Collection Time: 10/26/20  5:29 AM   Specimen: Left Antecubital; Blood  Result Value Ref Range Status   Specimen Description LEFT ANTECUBITAL  Final   Special Requests   Final    BOTTLES DRAWN AEROBIC AND ANAEROBIC Blood Culture adequate volume   Culture   Final    NO GROWTH 4 DAYS Performed at [facility], [address], [place], [place] [address]    Report Status PENDING  Incomplete  CULTURE, BLOOD (ROUTINE X 2) w  Reflex to ID Panel     Status: None (Preliminary result)   Collection Time: 10/26/20  7:19 AM   Specimen: BLOOD  Result Value Ref Range Status   Specimen Description BLOOD RAC  Final   Special Requests   Final    BOTTLES DRAWN AEROBIC AND ANAEROBIC Blood Culture adequate volume   Culture   Final    NO GROWTH 4 DAYS Performed at [facility], [address] [place],  [address]    Report Status PENDING  Incomplete    Coagulation Studies: No results for input(s): LABPROT, INR in the last 72 hours.  Urinalysis: No results for input(s): COLORURINE, LABSPEC, PHURINE, GLUCOSEU, HGBUR, BILIRUBINUR, KETONESUR, PROTEINUR, UROBILINOGEN, NITRITE, LEUKOCYTESUR in the last 72 hours.  Invalid input(s): APPERANCEUR    Imaging: No results found.   Medications:   . sodium chloride Stopped (10/28/20  3474)  . dextrose 5 % and 0.9% NaCl 30 mL/hr at 10/30/20 1125  . dialysis solution 1.5% low-MG/low-CA     . aspirin  81 mg Oral Daily  . atorvastatin  80 mg Oral Daily  . calcitRIOL  0.25 mcg Oral Daily  . calcium carbonate  1 tablet Oral BID WC  . carvedilol  25 mg Oral BID  . feeding supplement (NEPRO CARB STEADY)  237 mL Oral BID BM  . gentamicin cream  1 application Topical Daily  . heparin  5,000 Units Subcutaneous Q8H  . losartan  100 mg Oral Daily   And  . hydrochlorothiazide  12.5 mg Oral Daily  . insulin aspart  0-15 Units Subcutaneous TID WC  . insulin aspart  0-5 Units Subcutaneous QHS  . Ipratropium-Albuterol  1 puff Inhalation QID  . mouth rinse  15 mL Mouth Rinse BID  . methylPREDNISolone (SOLU-MEDROL) injection  60 mg Intravenous BID  . multivitamin  1 tablet Oral QHS  . torsemide  100 mg Oral Daily     Assessment/ Plan:  Mr. MORIO WIDEN is a 64 y.o.  male  has h/o COPD,CAD and ESRD on peritoneal dialysis.He came to ED with SOB, diagnosed with COVID 19 infection.   # ESRD on peritoneal dialysis  Patient received peritoneal dialysis treatment last  night without issues Will plan for PD treatment again tonight Patient appears hypovolemic Will start IV fluid at low rate   #Secondary hyperparathyroidism Lab Results  Component Value Date   CALCIUM 7.2 (L) 10/30/2020  On  Calcium supplements PO   #Hypertension BP 150/79  Continue current antihypertensive regimen  # Covid 19 pneumonia Still requiring 15 L of O2. Respiration slightly labored with accessory muscle use General decline in mental status noted, very lethargic   #Hyperkalemia Potassium stays stable, 4.4 today  #Diabetes Type ii with CKD  Continue current Insulin regimen Management per primary team  Crosby Oyster , MD [facility] 11/18/202112:22 PM    LOS: 8 Chaniya Genter 11/18/202112:22 PM
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
## 2020-10-31 ENCOUNTER — Inpatient Hospital Stay: Payer: Managed Care, Other (non HMO)

## 2020-10-31 DIAGNOSIS — I611 Nontraumatic intracerebral hemorrhage in hemisphere, cortical: Secondary | ICD-10-CM | POA: Diagnosis not present

## 2020-10-31 DIAGNOSIS — N051 Unspecified nephritic syndrome with focal and segmental glomerular lesions: Secondary | ICD-10-CM

## 2020-10-31 DIAGNOSIS — J441 Chronic obstructive pulmonary disease with (acute) exacerbation: Secondary | ICD-10-CM

## 2020-10-31 DIAGNOSIS — I10 Essential (primary) hypertension: Secondary | ICD-10-CM | POA: Diagnosis not present

## 2020-10-31 DIAGNOSIS — E43 Unspecified severe protein-calorie malnutrition: Secondary | ICD-10-CM | POA: Diagnosis present

## 2020-10-31 DIAGNOSIS — U071 COVID-19: Secondary | ICD-10-CM | POA: Diagnosis not present

## 2020-10-31 LAB — CBC
HCT: 29.7 % — ABNORMAL LOW (ref 39.0–52.0)
Hemoglobin: 10.4 g/dL — ABNORMAL LOW (ref 13.0–17.0)
MCH: 31.8 pg (ref 26.0–34.0)
MCHC: 35 g/dL (ref 30.0–36.0)
MCV: 90.8 fL (ref 80.0–100.0)
Platelets: 121 10*3/uL — ABNORMAL LOW (ref 150–400)
RBC: 3.27 MIL/uL — ABNORMAL LOW (ref 4.22–5.81)
RDW: 13 % (ref 11.5–15.5)
WBC: 26.5 10*3/uL — ABNORMAL HIGH (ref 4.0–10.5)
nRBC: 0 % (ref 0.0–0.2)

## 2020-10-31 LAB — CULTURE, BLOOD (ROUTINE X 2)
Culture: NO GROWTH
Culture: NO GROWTH
Special Requests: ADEQUATE
Special Requests: ADEQUATE

## 2020-10-31 LAB — BASIC METABOLIC PANEL
Anion gap: 22 — ABNORMAL HIGH (ref 5–15)
BUN: 227 mg/dL — ABNORMAL HIGH (ref 8–23)
CO2: 20 mmol/L — ABNORMAL LOW (ref 22–32)
Calcium: 7.1 mg/dL — ABNORMAL LOW (ref 8.9–10.3)
Chloride: 102 mmol/L (ref 98–111)
Creatinine, Ser: 9.22 mg/dL — ABNORMAL HIGH (ref 0.61–1.24)
GFR, Estimated: 6 mL/min — ABNORMAL LOW (ref >60)
Glucose, Bld: 242 mg/dL — ABNORMAL HIGH (ref 70–99)
Potassium: 4.2 mmol/L (ref 3.5–5.1)
Sodium: 144 mmol/L (ref 135–145)

## 2020-10-31 LAB — GLUCOSE, CAPILLARY: Glucose-Capillary: 221 mg/dL — ABNORMAL HIGH (ref 70–99)

## 2020-10-31 LAB — MAGNESIUM: Magnesium: 2.7 mg/dL — ABNORMAL HIGH (ref 1.7–2.4)

## 2020-10-31 LAB — C-REACTIVE PROTEIN: CRP: 2.8 mg/dL — ABNORMAL HIGH (ref <1.0)

## 2020-10-31 MED ORDER — LABETALOL HCL 5 MG/ML IV SOLN
10.0000 mg | INTRAVENOUS | Status: DC | PRN
  Administered 2020-10-31: 10 mg via INTRAVENOUS
  Filled 2020-10-31: qty 4

## 2020-10-31 MED ORDER — OSMOLITE 1.5 CAL PO LIQD: 1000.0000 mL | ORAL | Status: DC

## 2020-10-31 MED ORDER — RENA-VITE PO TABS: 1.0000 | ORAL_TABLET | Freq: Every day | ORAL | Status: DC

## 2020-10-31 MED ORDER — MORPHINE SULFATE (PF) 2 MG/ML IV SOLN
1.0000 mg | INTRAVENOUS | Status: DC | PRN
  Administered 2020-10-31 – 2020-11-02 (×12): 1 mg via INTRAVENOUS
  Filled 2020-10-31 (×12): qty 1

## 2020-10-31 MED ORDER — FREE WATER: 30.0000 mL | Status: DC

## 2020-10-31 MED ORDER — PROSOURCE TF PO LIQD
45.0000 mL | Freq: Two times a day (BID) | ORAL | Status: DC
  Filled 2020-10-31: qty 45

## 2020-10-31 NOTE — Progress Notes (Signed)
Pt is now comfort care, NG tube removed at this time, PRN Morphine 1mg  given via 20G piv to (L) hand at this time - will continue to monitor.

## 2020-10-31 NOTE — Progress Notes (Signed)
Pt off unit to receive CT head at this time, non-rebreather applied with O2 infusing at 25 LPM and N/C at 10 LPM Respiratory therapist at pt's side while receiving CT scan.
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
## 2020-10-31 NOTE — Progress Notes (Signed)
Patient ID: Willie Hahn, male   DOB: Jul 08, 1956, 64 y.o.   MRN: [id number]   Spoke with patient's daughter Garner Dullea over the phone and updated. Daughter is still in agreement with patient getting NG tube feeding trial. She understands the risk and complications involved. Plan B for peg tube feeding also presented.

## 2020-10-31 NOTE — Progress Notes (Addendum)
Patient ID: RAYLIN WINER, male   DOB: Feb 18, 1956, 64 y.o.   MRN: [id number]  Call by radiology regarding CT head results. Acute left parietal intracranial hemorrhage, with estimated volume approximately 36 cubic cm as above. Associated vasogenic edema with mass effect on the left posterior horn of the ventricle and no significant midline shift. Additional small foci of right-sided subarachnoid hemorrhage in the frontal and parietal convexities as well as an additional focus of parenchymal hemorrhage in the left frontal subcortical white matter.   Spoke with Dr. Curly Shores and she did a stat neurology consult. Patient has very poor prognosis and appears to be posturing.  I was able to get in touch with patient's brother Tehran Rabenold. Explained the grave prognosis for patient. Mr. Manzer voiced understanding and request comfort care, continue DNR DNI. He understands will be stopping dialysis. Mr. Leaf understands patient will not survive hospitalization. I have discontinued heparin and aspirin.  Left message for daughter Gahel Safley. I have not heard from her yet.  Dr Curly Shores has spoken with pt's brother koehn salehi and updated above  5:16 PM--spoke with Leonidas Romberg and updated. She is understand poor prognosis and is ok with comfort care measures.  Dr Holley Raring has been informed of above. He is aware that PD is going to be discontinued

## 2020-10-31 NOTE — Progress Notes (Signed)
   10/31/20 1036  Assess: MEWS Score  Temp 98 F (36.7 C)  BP (!) 165/72  Pulse Rate 85  Resp 20  Level of Consciousness Responds to Pain  SpO2 92 %  O2 Device HFNC  Patient Activity (if Appropriate) In chair  Heater temperature 87.6 F (30.9 C)  O2 Flow Rate (L/min) 45 L/min  FiO2 (%) 45 %  Assess: MEWS Score  MEWS Temp 0  MEWS Systolic 0  MEWS Pulse 0  MEWS RR 0  MEWS LOC 2  MEWS Score 2  MEWS Score Color Yellow  Assess: if the MEWS score is Yellow or Red  Were vital signs taken at a resting state? Yes  Focused Assessment Change from prior assessment (see assessment flowsheet)  Early Detection of Sepsis Score *See Row Information* Low  MEWS guidelines implemented *See Row Information* Yes  Treat  MEWS Interventions Other (Comment)  Pain Scale 0-10  Pain Score 0  Take Vital Signs  Increase Vital Sign Frequency  Yellow: Q 2hr X 2 then Q 4hr X 2, if remains yellow, continue Q 4hrs  Escalate  MEWS: Escalate Yellow: discuss with charge nurse/RN and consider discussing with provider and RRT  Notify: Charge Nurse/RN  Name of Charge Nurse/RN Notified Arlyss Gandy, RN  Date Charge Nurse/RN Notified 10/31/20  Time Charge Nurse/RN Notified 1036  Notify: Provider  Provider Name/Title Fritzi Mandes  Date Provider Notified 10/31/20  Time Provider Notified 1036  Notification Type Page  Notification Reason Other (Comment) (MEWS)  Response No new orders  Date of Provider Response 10/31/20  Time of Provider Response 1038

## 2020-10-31 NOTE — Progress Notes (Signed)
   10/31/20 1236  Assess: MEWS Score  Temp 98.2 F (36.8 C)  BP (!) 146/84  Pulse Rate 80  Resp 20  O2 Device HFNC  Patient Activity (if Appropriate) In bed  O2 Flow Rate (L/min) 45 L/min  FiO2 (%) 45 %  Assess: MEWS Score  MEWS Temp 0  MEWS Systolic 0  MEWS Pulse 0  MEWS RR 0  MEWS LOC 2  MEWS Score 2  MEWS Score Color Yellow

## 2020-10-31 NOTE — Plan of Care (Signed)
  Problem: Education: Goal: Knowledge of risk factors and measures for prevention of condition will improve Outcome: Not Progressing   Problem: Coping: Goal: Psychosocial and spiritual needs will be supported Outcome: Progressing   Problem: Respiratory: Goal: Will maintain a patent airway Outcome: Progressing   Problem: Education: Goal: Knowledge of General Education information will improve Description: Including pain rating scale, medication(s)/side effects and non-pharmacologic comfort measures Outcome: Not Progressing   Problem: Clinical Measurements: Goal: Ability to maintain clinical measurements within normal limits will improve Outcome: Progressing Goal: Will remain free from infection Outcome: Not Progressing Goal: Diagnostic test results will improve Outcome: Not Progressing Goal: Respiratory complications will improve Outcome: Progressing Goal: Cardiovascular complication will be avoided Outcome: Progressing   Problem: Elimination: Goal: Will not experience complications related to bowel motility Outcome: Progressing Goal: Will not experience complications related to urinary retention Outcome: Progressing   Problem: Pain Managment: Goal: General experience of comfort will improve Outcome: Progressing   Problem: Safety: Goal: Ability to remain free from injury will improve Outcome: Progressing   Problem: Skin Integrity: Goal: Risk for impaired skin integrity will decrease Outcome: Progressing

## 2020-10-31 NOTE — Progress Notes (Signed)
Pt responsive to voice. Only opens eyes. Doesn't respond to questions, follow commands, or open mouth for medications. Unable to go to CT due to PD in process. Provider notified. Will continue to monitor BP & mental status changes.

## 2020-10-31 NOTE — Progress Notes (Signed)
Palliative:  Chart reviewed. Received updates from Dr. Posey Pronto and RN. Patient had feeding tube placed. Mental status worse - to have head CT. Attempted to call daughter for ongoing [place] discussion - no answer and no return call. Voicemail left with call back number.   Will follow.  Juel Burrow, DNP, AGNP-C Palliative Medicine Team Team Phone # [phone]  Pager # [phone]  NO CHARGE

## 2020-10-31 NOTE — Progress Notes (Signed)
[facility] NAME: Willie Hahn    MR#:  [id number]  DATE OF BIRTH:  Nov 12, 1956  SUBJECTIVE:   Patient very lethargic. Unable to hold any meaningful conversation. No improvement. No PO intake. Remains on high flow nasal cannula oxygen. Continues with PD during nighttime. REVIEW OF SYSTEMS:   Review of Systems  Unable to perform ROS: Mental status change   Tolerating Diet:not eating Tolerating PT: unable to participate secondary to change in mental status  DRUG ALLERGIES:   Allergies  Allergen Reactions  . Ambien [Zolpidem Tartrate] Other (See Comments)    Headaches...migraines    VITALS:  Blood pressure (!) 165/72, pulse 85, temperature 98 F (36.7 C), temperature source Axillary, resp. rate 20, height 5\' 6"  (1.676 m), weight 78.4 kg, SpO2 92 %.  PHYSICAL EXAMINATION:   Physical Examlimited due to patient participation  GENERAL:  64 y.o.-year-old patient lying in the bed with no acute distress. disheveled HEENT: Head atraumatic, normocephalic. Oropharynx and nasopharynx clear.  LUNGS: Normal breath sounds bilaterally, no wheezing, rales, rhonchi. No use of accessory muscles of respiration.  CARDIOVASCULAR: S1, S2 normal. No murmurs, rubs, or gallops.  ABDOMEN: Soft, nontender, nondistended. Bowel sounds present. No organomegaly or mass. PD catheter present+ brusies on the abdomen from lovenox shots EXTREMITIES: No cyanosis, clubbing or edema b/l.    NEUROLOGIC: grossly non focal unable to participate due to Change in [place]:  patient is lethargic.  SKIN: No obvious rash, lesion, or ulcer.--per RN LABORATORY PANEL:  CBC Recent Labs  Lab 10/31/20 0603  WBC 26.5*  HGB 10.4*  HCT 29.7*  PLT 121*    Chemistries  Recent Labs  Lab 10/31/20 0603  NA 144  K 4.2  CL 102  CO2 20*  GLUCOSE 242*  BUN 227*  CREATININE 9.22*  CALCIUM 7.1*  MG 2.7*   Cardiac Enzymes No results for input(s): TROPONINI in the  last 168 hours. RADIOLOGY:  No results found. ASSESSMENT AND PLAN:   Latravious Levitt Smithis a 64 y.o.malewith medical history significant forhypertension, hyperlipidemia, gout in remission, end-stage renal disease on peritoneal dialysis presented to the emergency department for chief concerns ofshortness of breath x6 days worsening with minimal improvement with home inhaler for his COPD.  Acute metabolic encephalopathy suspect due to hypoxia and COVID infection --Avoid sedating meds -- CT head today -- remains very lethargic unable to hold any conversation.   Acute hypoxic respiratory failure secondary to COVID-19 PNA, worsening Secondary bacterial PNA -Status post Solu-Medrol 125 mg IV per ED provider --O2 requirement was 6L, had acute worsening of hypoxic respiratory failure overnight 11/13, had to be put on heated hf. --CRP 7.7--now down to 4.8 --procal 2.33, empiric ceftriaxone/azithromycin started. --completed Remdesivir -- patient received high dose of Solu-Medrol. CRP down to 4.8. Will change to PO Decadron in 1-2 days --cont Combivent QID --cont ceftriaxone and azithromycin (day 6/7) --Continue supplemental O2 to keep sats between 88-92%, wean as tolerated  CAD --on home atorvastatin 80 mg daily, Coreg 25 mg twice daily, Imdur 30 mg daily, aspirin 81 mg daily  COPD  --Continue supplemental O2 to keep sats between 88-92%, wean as tolerated -cont Combivent as QID -Albuterol PRN q4h for shortness of breath and wheezing  Hypertension  -Onhomeamlodipine 10 mg daily, carvedilol 25 mg bid, hydralazine 50 mg TID, losartan-hctz 100-12.5 dailyat home, torsemide --BP starting to trend up --cont home coreg and torsemide-- patient has not been able to take his PO meds last few  days.  ESRD on peritoneal dialysis Secondary hyperparathyroidism -Nephrology input appreciated --cont home torsemide 100 mg daily --cont inpatient PD per nephrology --cont Calcitriol and Ca  Carbonate  Hyperkalemia --improved with Veltassa --Monitor   Hyperlipidemia - atorvastatin 80 mg daily  Tobacco user -patient not ready to quit  Hyperglycemia, likely due to steroid use Prediabetic --A1c 6.1 -- hold Levemir today since patient has not been eating for last few days. --continue SSI  failure to thrive/malnutrition/poor PO intake with change in mentation -- overall patient seems to be not improving last several days. Has multiple comorbidities. -- Appreciate palliative care consultation  for goals of care. -- 11/18-- discussed with daughter Margreta Journey regarding patient's poor condition and not much improvement. She wants to give trial with NG tube feeding. Discussed risk and complications involved. She voiced understanding. Nutrition Problem: Severe Malnutrition Etiology: chronic illness (COPD, ESRD on PD) Signs/Symptoms: moderate fat depletion, severe fat depletion, moderate muscle depletion, severe muscle depletion   DVT prophylaxis: Heparin SQ Code Status:  DNR Family Communication: 11/19-- left message for Dai Mcadams daughter to call me back Status is: inpatient Dispo:   The patient is from: home Anticipated d/c is to: undetermined Anticipated d/c date is: >3 days Patient currently is not medically stable to d/c due to: patient having confusion and currently requiring high flow nasal cannula oxygen for COVID infection/acute hypoxic respiratory failure  Patient has failure to thrive. Poor PO intake. Remains lethargic. To be started on NG tube feeding. Overall poor prognosis at present.  TOTAL TIME TAKING CARE OF THIS PATIENT: *25* minutes.  >50% time spent on counselling and coordination of care  Note: This dictation was prepared with Dragon dictation along with smaller phrase technology. Any transcriptional errors that result from this process are unintentional.  Fritzi Mandes M.D    [facility]   CC: Primary care physician; Birdie Sons, MDPatient ID: Willie Hahn, male   DOB: July 05, 1956, 64 y.o.   MRN: [id number]

## 2020-10-31 NOTE — Plan of Care (Signed)
°  Problem: Clinical Measurements: °Goal: Respiratory complications will improve °Outcome: Progressing °  °Problem: Pain Managment: °Goal: General experience of comfort will improve °Outcome: Progressing °  °Problem: Safety: °Goal: Ability to remain free from injury will improve °Outcome: Progressing °  °

## 2020-10-31 NOTE — Progress Notes (Signed)
PHARMACY CONSULT NOTE - FOLLOW UP  Pharmacy Consult for Electrolyte Monitoring and Replacement   Recent Labs: Potassium (mmol/L)  Date Value  10/31/2020 4.2   Magnesium (mg/dL)  Date Value  10/31/2020 2.7 (H)   Calcium (mg/dL)  Date Value  10/31/2020 7.1 (L)   Albumin (g/dL)  Date Value  10/22/2020 2.7 (L)  05/30/2015 3.8   Sodium (mmol/L)  Date Value  10/31/2020 144  05/30/2015 141   Corrected Ca: 8.14 mg/dL  Assessment: 64 y.o.malewith medical history significant forhypertension, hyperlipidemia, gout in remission, end-stage renal disease on peritoneal dialysis. He is beginning NG tube feedings and is at a high risk for refeeding   Goal of Therapy:  Electrolytes WNL  Plan:   No electrolyte replacement warranted today  RFP + magnesium level ordered for tomorrow am  Pharmacy will replace electrolytes as needed  Dallie Piles ,PharmD Clinical Pharmacist 10/31/2020 3:43 PM

## 2020-10-31 NOTE — Plan of Care (Signed)
  Problem: Education: Goal: Knowledge of risk factors and measures for prevention of condition will improve Outcome: Not Progressing   Problem: Coping: Goal: Psychosocial and spiritual needs will be supported Outcome: Not Progressing   Problem: Respiratory: Goal: Will maintain a patent airway Outcome: Not Progressing Goal: Complications related to the disease process, condition or treatment will be avoided or minimized Outcome: Not Progressing   Problem: Education: Goal: Knowledge of General Education information will improve Description: Including pain rating scale, medication(s)/side effects and non-pharmacologic comfort measures Outcome: Not Progressing   Problem: Clinical Measurements: Goal: Ability to maintain clinical measurements within normal limits will improve Outcome: Not Progressing Goal: Will remain free from infection Outcome: Not Progressing Goal: Diagnostic test results will improve Outcome: Not Progressing Goal: Respiratory complications will improve Outcome: Not Progressing Goal: Cardiovascular complication will be avoided Outcome: Not Progressing   Problem: Elimination: Goal: Will not experience complications related to bowel motility Outcome: Not Progressing Goal: Will not experience complications related to urinary retention Outcome: Not Progressing   Problem: Pain Managment: Goal: General experience of comfort will improve Outcome: Not Progressing   Problem: Safety: Goal: Ability to remain free from injury will improve Outcome: Not Progressing   Problem: Skin Integrity: Goal: Risk for impaired skin integrity will decrease Outcome: Not Progressing

## 2020-10-31 NOTE — Plan of Care (Signed)
  Problem: Education: Goal: Knowledge of risk factors and measures for prevention of condition will improve 10/31/2020 1353 by Feliciana Rossetti, RN Outcome: Not Progressing 10/31/2020 1353 by Feliciana Rossetti, RN Outcome: Not Progressing   Problem: Coping: Goal: Psychosocial and spiritual needs will be supported 10/31/2020 1353 by Feliciana Rossetti, RN Outcome: Not Progressing 10/31/2020 1353 by Feliciana Rossetti, RN Outcome: Not Progressing   Problem: Respiratory: Goal: Will maintain a patent airway 10/31/2020 1353 by Feliciana Rossetti, RN Outcome: Not Progressing 10/31/2020 1353 by Feliciana Rossetti, RN Outcome: Not Progressing Goal: Complications related to the disease process, condition or treatment will be avoided or minimized 10/31/2020 1353 by Feliciana Rossetti, RN Outcome: Not Progressing 10/31/2020 1353 by Feliciana Rossetti, RN Outcome: Not Progressing   Problem: Education: Goal: Knowledge of General Education information will improve Description: Including pain rating scale, medication(s)/side effects and non-pharmacologic comfort measures 10/31/2020 1353 by Feliciana Rossetti, RN Outcome: Not Progressing 10/31/2020 1353 by Feliciana Rossetti, RN Outcome: Not Progressing   Problem: Clinical Measurements: Goal: Ability to maintain clinical measurements within normal limits will improve 10/31/2020 1353 by Feliciana Rossetti, RN Outcome: Not Progressing 10/31/2020 1353 by Feliciana Rossetti, RN Outcome: Not Progressing Goal: Will remain free from infection 10/31/2020 1353 by Feliciana Rossetti, RN Outcome: Not Progressing 10/31/2020 1353 by Feliciana Rossetti, RN Outcome: Not Progressing Goal: Diagnostic test results will improve 10/31/2020 1353 by Feliciana Rossetti, RN Outcome: Not Progressing 10/31/2020 1353 by Feliciana Rossetti, RN Outcome: Not Progressing Goal: Respiratory complications will improve 10/31/2020 1353 by Feliciana Rossetti, RN Outcome: Not  Progressing 10/31/2020 1353 by Feliciana Rossetti, RN Outcome: Not Progressing Goal: Cardiovascular complication will be avoided 10/31/2020 1353 by Feliciana Rossetti, RN Outcome: Not Progressing 10/31/2020 1353 by Feliciana Rossetti, RN Outcome: Not Progressing   Problem: Elimination: Goal: Will not experience complications related to bowel motility 10/31/2020 1353 by Feliciana Rossetti, RN Outcome: Not Progressing 10/31/2020 1353 by Feliciana Rossetti, RN Outcome: Not Progressing Goal: Will not experience complications related to urinary retention 10/31/2020 1353 by Feliciana Rossetti, RN Outcome: Not Progressing 10/31/2020 1353 by Feliciana Rossetti, RN Outcome: Not Progressing   Problem: Pain Managment: Goal: General experience of comfort will improve 10/31/2020 1353 by Feliciana Rossetti, RN Outcome: Not Progressing 10/31/2020 1353 by Feliciana Rossetti, RN Outcome: Not Progressing   Problem: Safety: Goal: Ability to remain free from injury will improve 10/31/2020 1353 by Feliciana Rossetti, RN Outcome: Not Progressing 10/31/2020 1353 by Feliciana Rossetti, RN Outcome: Not Progressing   Problem: Skin Integrity: Goal: Risk for impaired skin integrity will decrease 10/31/2020 1353 by Feliciana Rossetti, RN Outcome: Not Progressing 10/31/2020 1353 by Feliciana Rossetti, RN Outcome: Not Progressing

## 2020-10-31 NOTE — Progress Notes (Signed)
[facility]  ROUNDING NOTE   Subjective:  Patient's mental status progressively worsening, hardly arousable today, opened his eyes for a moment, with multiple attempts of calling his name, and gentle shaking.No verbal response.   Objective:  Vital signs in last 24 hours:  Temp:  [97.7 F (36.5 C)-98.8 F (37.1 C)] 98.7 F (37.1 C) (11/19 0431) Pulse Rate:  [61-96] 91 (11/19 0431) Resp:  [16-18] 18 (11/19 0431) BP: (150-185)/(78-91) 185/91 (11/19 0431) SpO2:  [95 %-100 %] 97 % (11/19 0431) FiO2 (%):  [50 %] 50 % (11/19 0100) Weight:  [78.4 kg] 78.4 kg (11/19 0520)  Weight change:  Filed Weights   10/28/20 0521 10/29/20 0336 10/31/20 0520  Weight: 81.9 kg 80.5 kg 78.4 kg    Intake/Output: I/O last 3 completed shifts: In: 1352.4 [I.V.:533.8; IV Piggyback:818.6] Out: -    Intake/Output this shift:  No intake/output data recorded.  Physical Exam: General: Lying in bed with eyes closed, very lethargic  Head: Normocephalic, atraumatic  Eyes:  Anicteric  Lungs:   Respirations labored, accessory muscle use +,Lungs clear, on supplemental O2  Heart:  Regular rate and rhythm  Abdomen:  Soft, nontender, non distended  Extremities:  No  peripheral edema.  Neurologic:  Opens eyes to tactile stimulation, unable to communicate verbally,appears very drowsy  Skin: No acute lesions or rashes  Access: Peritoneal  catheter    Basic Metabolic Panel: Recent Labs  Lab 10/27/20 0500 10/27/20 0500 10/28/20 0450 10/28/20 0450 10/29/20 0557 10/30/20 0708 10/31/20 0603  NA 139  --  136  --  141 144 144  K 5.7*  --  4.6  --  4.2 4.4 4.2  CL 96*  --  97*  --  100 98 102  CO2 15*  --  16*  --  18* 19* 20*  GLUCOSE 210*  --  265*  --  132* 244* 242*  BUN 243*  --  242*  --  229* 242* 227*  CREATININE 11.09*  --  10.46*  --  9.58* 9.82* 9.22*  CALCIUM 6.9*   < > 6.5*   < > 6.8* 7.2* 7.1*  MG 2.2  --  2.3  --  2.3 2.8* 2.7*   < > = values in this interval not displayed.     Liver Function Tests: No results for input(s): AST, ALT, ALKPHOS, BILITOT, PROT, ALBUMIN in the last 168 hours. No results for input(s): LIPASE, AMYLASE in the last 168 hours. No results for input(s): AMMONIA in the last 168 hours.  CBC: Recent Labs  Lab 10/27/20 0500 10/28/20 0450 10/29/20 0557 10/30/20 0708 10/31/20 0603  WBC 23.2* 11.3* 15.9* 16.1* 26.5*  HGB 12.6* 10.8* 11.4* 11.5* 10.4*  HCT 35.4* 31.1* 31.6* 32.4* 29.7*  MCV 89.2 90.4 88.5 91.0 90.8  PLT 167 127* 143* 139* 121*    Cardiac Enzymes: No results for input(s): CKTOTAL, CKMB, CKMBINDEX, TROPONINI in the last 168 hours.  BNP: Invalid input(s): POCBNP  CBG: Recent Labs  Lab 10/29/20 2034 10/30/20 0821 10/30/20 1148 10/30/20 1645 10/30/20 2159  GLUCAP 162* 227* 192* 160* 277*    Microbiology: Results for orders placed or performed during the hospital encounter of 11/02/2020  Respiratory Panel by RT PCR (Flu A&B, Covid) - Nasopharyngeal Swab     Status: Abnormal   Collection Time: 10/19/2020  1:16 PM   Specimen: Nasopharyngeal Swab  Result Value Ref Range Status   SARS Coronavirus 2 by RT PCR POSITIVE (A) NEGATIVE Final    Comment: RESULT  CALLED TO, READ BACK BY AND VERIFIED WITH: REID RENO 11/04/2020 1433 KLW (NOTE) SARS-CoV-2 target nucleic acids are DETECTED.  SARS-CoV-2 RNA is generally detectable in upper respiratory specimens  during the acute phase of infection. Positive results are indicative of the presence of the identified virus, but do not rule out bacterial infection or co-infection with other pathogens not detected by the test. Clinical correlation with patient history and other diagnostic information is necessary to determine patient infection status. The expected result is Negative.  Fact Sheet for Patients:  PinkCheek.be  Fact Sheet for Healthcare Providers: GravelBags.it  This test is not yet approved or cleared by the  [place] FDA and  has been authorized for detection and/or diagnosis of SARS-CoV-2 by FDA under an Emergency Use Authorization (EUA).  This EUA will remain in effect (meaning this test can be used) fo r the duration of  the COVID-19 declaration under Section 564(b)(1) of the Act, 21 U.S.C. section 360bbb-3(b)(1), unless the authorization is terminated or revoked sooner.      Influenza A by PCR NEGATIVE NEGATIVE Final   Influenza B by PCR NEGATIVE NEGATIVE Final    Comment: (NOTE) The Xpert Xpress SARS-CoV-2/FLU/RSV assay is intended as an aid in  the diagnosis of influenza from Nasopharyngeal swab specimens and  should not be used as a sole basis for treatment. Nasal washings and  aspirates are unacceptable for Xpert Xpress SARS-CoV-2/FLU/RSV  testing.  Fact Sheet for Patients: PinkCheek.be  Fact Sheet for Healthcare Providers: GravelBags.it  This test is not yet approved or cleared by the [place] FDA and  has been authorized for detection and/or diagnosis of SARS-CoV-2 by  FDA under an Emergency Use Authorization (EUA). This EUA will remain  in effect (meaning this test can be used) for the duration of the  Covid-19 declaration under Section 564(b)(1) of the Act, 21  U.S.C. section 360bbb-3(b)(1), unless the authorization is  terminated or revoked. Performed at [facility], [address] [place], [place] [address]   CULTURE, BLOOD (ROUTINE X 2) w Reflex to ID Panel     Status: None   Collection Time: 10/26/20  5:29 AM   Specimen: Left Antecubital; Blood  Result Value Ref Range Status   Specimen Description LEFT ANTECUBITAL  Final   Special Requests   Final    BOTTLES DRAWN AEROBIC AND ANAEROBIC Blood Culture adequate volume   Culture   Final    NO GROWTH 5 DAYS Performed at [facility], [address] [place], [place] [address]    Report Status 10/31/2020 FINAL  Final  CULTURE,  BLOOD (ROUTINE X 2) w Reflex to ID Panel     Status: None   Collection Time: 10/26/20  7:19 AM   Specimen: BLOOD  Result Value Ref Range Status   Specimen Description BLOOD RAC  Final   Special Requests   Final    BOTTLES DRAWN AEROBIC AND ANAEROBIC Blood Culture adequate volume   Culture   Final    NO GROWTH 5 DAYS Performed at [facility], [address], [place], [place] [address]    Report Status 10/31/2020 FINAL  Final    Coagulation Studies: No results for input(s): LABPROT, INR in the last 72 hours.  Urinalysis: No results for input(s): COLORURINE, LABSPEC, PHURINE, GLUCOSEU, HGBUR, BILIRUBINUR, KETONESUR, PROTEINUR, UROBILINOGEN, NITRITE, LEUKOCYTESUR in the last 72 hours.  Invalid input(s): APPERANCEUR    Imaging: No results found.   Medications:   . sodium chloride Stopped (10/28/20 0416)  .  azithromycin    . cefTRIAXone (ROCEPHIN)  IV    . dextrose 5 % and 0.9% NaCl 30 mL/hr at 10/30/20 1855  . dialysis solution 1.5% low-MG/low-CA     . aspirin  81 mg Oral Daily  . atorvastatin  80 mg Oral Daily  . calcitRIOL  0.25 mcg Oral Daily  . calcium carbonate  1 tablet Oral BID WC  . carvedilol  25 mg Oral BID  . feeding supplement (NEPRO CARB STEADY)  237 mL Oral BID BM  . gentamicin cream  1 application Topical Daily  . heparin  5,000 Units Subcutaneous Q8H  . losartan  100 mg Oral Daily   And  . hydrochlorothiazide  12.5 mg Oral Daily  . insulin aspart  0-15 Units Subcutaneous TID WC  . insulin aspart  0-5 Units Subcutaneous QHS  . Ipratropium-Albuterol  1 puff Inhalation QID  . mouth rinse  15 mL Mouth Rinse BID  . methylPREDNISolone (SOLU-MEDROL) injection  60 mg Intravenous Daily  . multivitamin  1 tablet Oral QHS  . multivitamin with minerals  1 tablet Oral Daily  . torsemide  100 mg Oral Daily     Assessment/ Plan:  Mr. OSWALD POTT is a 64 y.o.  male  has h/o COPD,CAD and ESRD on peritoneal dialysis.He came to ED with SOB, diagnosed  with COVID 19 infection.   # ESRD on peritoneal dialysis  Patient continues to receive peritoneal dialysis sessions nightly, alarms went off last night for low drain volume, patient possibly hypovolemic. Patient started on IV fluid at low rate from yesterday Urine draining via condom catheter about 800 ml of clear yellow urine noted in the bag.   #Secondary hyperparathyroidism Lab Results  Component Value Date   CALCIUM 7.1 (L) 10/31/2020  Patient is on Calcium carbonate and Calcitriol, however unable to take medicines orally now, high risk for aspiration Primary team considering  NGT placement, pending discussion  with family  #Hypertension Blood pressure readings within acceptable range today  # Covid 19 pneumonia Patient is on Azithromycin and Ceftriaxone Also on steroids Still requiring supplemental O2 via HFNC to maintain SpO2 above 90.   #Hyperkalemia Potassium 4.2 Will continue monitoring  #Diabetes Type ii with CKD  Lab Results  Component Value Date   HGBA1C 6.1 (H) 10/23/2020  Blood glucose readings above goal, on  Insulin Aspart  Crosby Oyster , MD [facility] 11/19/20219:38 AM    LOS: 9 Christyn Gutkowski 11/19/20219:38 AM

## 2020-11-01 DIAGNOSIS — Z992 Dependence on renal dialysis: Secondary | ICD-10-CM

## 2020-11-01 DIAGNOSIS — Z66 Do not resuscitate: Secondary | ICD-10-CM | POA: Diagnosis not present

## 2020-11-01 DIAGNOSIS — I629 Nontraumatic intracranial hemorrhage, unspecified: Secondary | ICD-10-CM

## 2020-11-01 DIAGNOSIS — N186 End stage renal disease: Secondary | ICD-10-CM

## 2020-11-01 DIAGNOSIS — J9601 Acute respiratory failure with hypoxia: Secondary | ICD-10-CM | POA: Diagnosis not present

## 2020-11-01 LAB — RENAL FUNCTION PANEL
Albumin: 2.2 g/dL — ABNORMAL LOW (ref 3.5–5.0)
Anion gap: 24 — ABNORMAL HIGH (ref 5–15)
BUN: 241 mg/dL — ABNORMAL HIGH (ref 8–23)
CO2: 18 mmol/L — ABNORMAL LOW (ref 22–32)
Calcium: 7.3 mg/dL — ABNORMAL LOW (ref 8.9–10.3)
Chloride: 106 mmol/L (ref 98–111)
Creatinine, Ser: 9.61 mg/dL — ABNORMAL HIGH (ref 0.61–1.24)
GFR, Estimated: 6 mL/min — ABNORMAL LOW (ref >60)
Glucose, Bld: 243 mg/dL — ABNORMAL HIGH (ref 70–99)
Phosphorus: 11.9 mg/dL — ABNORMAL HIGH (ref 2.5–4.6)
Potassium: 3.9 mmol/L (ref 3.5–5.1)
Sodium: 148 mmol/L — ABNORMAL HIGH (ref 135–145)

## 2020-11-01 NOTE — Progress Notes (Signed)
[place] [facility] [place] NAME: Kazim Corrales    MR#:  [id number]  DATE OF BIRTH:  02-14-1956  SUBJECTIVE:   Patient remains unresponsive. Posturing. Has been placed under comfort care orders since last night. REVIEW OF SYSTEMS:   Review of Systems  Unable to perform ROS: Mental status change     DRUG ALLERGIES:   Allergies  Allergen Reactions  . Ambien [Zolpidem Tartrate] Other (See Comments)    Headaches...migraines    VITALS:  Blood pressure (!) 161/80, pulse 84, temperature 98.5 F (36.9 C), temperature source Axillary, resp. rate 17, height 5\' 6"  (1.676 m), weight 72.5 kg, SpO2 94 %.  PHYSICAL EXAMINATION:   Physical Examlimited due to patient participation  GENERAL:  63 y.o.-year-old patient lying in the bed with no acute distress. disheveled LUNGS: Normal breath sounds bilaterally, no wheezing, rales, rhonchi. No use of accessory muscles of respiration.  CARDIOVASCULAR: S1, S2 normal. No murmurs, rubs, or gallops.  NEUROLOGIC: unresponsive PSYCHIATRIC:  patient is unresponsive  LABORATORY PANEL:  CBC Recent Labs  Lab 10/31/20 0603  WBC 26.5*  HGB 10.4*  HCT 29.7*  PLT 121*    Chemistries  Recent Labs  Lab 10/31/20 0603 10/31/20 0603 11/01/20 0454  NA 144   < > 148*  K 4.2   < > 3.9  CL 102   < > 106  CO2 20*   < > 18*  GLUCOSE 242*   < > 243*  BUN 227*   < > 241*  CREATININE 9.22*   < > 9.61*  CALCIUM 7.1*   < > 7.3*  MG 2.7*  --   --    < > = values in this interval not displayed.   Cardiac Enzymes No results for input(s): TROPONINI in the last 168 hours. RADIOLOGY:  DG Abd 1 View  Result Date: 10/31/2020 CLINICAL DATA:  Evaluate NG tube placement EXAM: ABDOMEN - 1 VIEW COMPARISON:  None. FINDINGS: The distal tip of the NG tube is above the GE junction. The side port is approximately 9.5 cm above the GE junction. IMPRESSION: The side port of the NG tube is 9.5 cm above the expected location of the GE  junction. Recommend advancing at least 11 cm. These results will be called to the ordering clinician or representative by the Radiologist Assistant, and communication documented in the PACS or [employer]. Electronically Signed   By: Dorise Bullion III M.D   On: 10/31/2020 16:15   CT HEAD WO CONTRAST  Result Date: 10/31/2020 CLINICAL DATA:  64 year old male with a history of confusion EXAM: CT HEAD WITHOUT CONTRAST TECHNIQUE: Contiguous axial images were obtained from the base of the skull through the vertex without intravenous contrast. COMPARISON:  None. FINDINGS: Brain: Acute hemorrhage of the left parietooccipital lobe, with hemorrhage length estimated greatest 51 mm, estimated diameter 36 mm. Eight CT slice estimated with the 5 mm with. The volume is calculated approximately 36-37 cubic cm. Surrounding vasogenic edema extending to the cortex of the convexity and along the midline. Distortion and compression of the underlying left posterior horn of the ventricle. Smaller second focus of hemorrhage within the left subcortical frontal white matter, 3 mm-4 mm. Edema present within the subcortical white matter in the frontal lobe. Hyperdense focus within the sulci of the right frontal and parietal region without associated mass effect. No significant midline shift at the foramen of Monro. No evidence of hydrocephalus on the current CT. Vascular: Minimal intracranial atherosclerosis.  Skull: No acute fracture. Sinuses/Orbits: Unremarkable paranasal sinuses and the orbits. No fluid in the mastoid air cells. Other: None IMPRESSION: Acute left parietal intracranial hemorrhage, with estimated volume approximately 36 cubic cm as above. Associated vasogenic edema with mass effect on the left posterior horn of the ventricle and no significant midline shift. These preliminary results were discussed by telephone at the time of interpretation on 10/31/2020 at 4:30 pm with Dr. Fritzi Mandes Additional small foci of  right-sided subarachnoid hemorrhage in the frontal and parietal convexities as well as an additional focus of parenchymal hemorrhage in the left frontal subcortical white matter. Electronically Signed   By: Corrie Mckusick D.O.   On: 10/31/2020 16:36   ASSESSMENT AND PLAN:   Woodson Macha Smithis a 64 y.o.malewith medical history significant forhypertension, hyperlipidemia, gout in remission, end-stage renal disease on peritoneal dialysis presented to the emergency department for chief concerns ofshortness of breath x6 days worsening with minimal improvement with home inhaler for his COPD.  Acute metabolic encephalopathy secondary to severe intracranial hemorrhage with subarachnoid hemorrhage with history of hypertension and understanding of COVID infection  patient is under comfort care.    Acute hypoxic respiratory failure secondary to COVID-19 PNA, worsening Secondary bacterial PNA  CAD  COPD   Hypertension   ESRD on peritoneal dialysis-- dialysis has been discontinued Secondary hyperparathyroidism  Hyperkalemia  Hyperlipidemia   Tobacco user  -failure to thrive/malnutrition/poor PO intake with change in mentation Nutrition Problem: Severe Malnutrition Etiology: chronic illness (COPD, ESRD on PD) Signs/Symptoms: moderate fat depletion, severe fat depletion, moderate muscle depletion, severe muscle depletion   DVT prophylaxis:  comfort care  code Status:  DNR Family Communication:  family aware  status is: inpatient Dispo:   patient has of very poor prognosis. Continue comfort care. Likely will have in hospital death. TOTAL TIME TAKING CARE OF THIS PATIENT: *15* minutes.  >50% time spent on counselling and coordination of care  Note: This dictation was prepared with Dragon dictation along with smaller phrase technology. Any transcriptional errors that result from this process are unintentional.  Fritzi Mandes M.D    [facility]   CC: Primary care  physician; Birdie Sons, MDPatient ID: [name], male   DOB: 26-Dec-1955, 64 y.o.   MRN: [id number]

## 2020-11-01 NOTE — Progress Notes (Signed)
Willie Hahn  MRN: [id number]  DOB/AGE: 62-Jul-1957 62 y.o.  Primary Care Physician:Hahn, Willie Peri, MD  Admit date: 10/29/2020  Chief Complaint:  Chief Complaint  Patient presents with  . Shortness of Breath    S-Pt presented on  10/26/2020 with  Chief Complaint  Patient presents with  . Shortness of Breath  .    Pt is unresposive, lying comfortably in the bed  Medications   . gentamicin cream  1 application Topical Daily  . mouth rinse  15 mL Mouth Rinse BID      ROS: Unable to get any data  Physical Exam: Vital signs in last 24 hours: Temp:  [98 F (36.7 C)-98.6 F (37 C)] 98.6 F (37 C) (11/19 1955) Pulse Rate:  [80-88] 80 (11/19 1641) Resp:  [20] 20 (11/19 1955) BP: (146-187)/(72-87) 187/87 (11/19 1955) SpO2:  [92 %-98 %] 94 % (11/20 0725) FiO2 (%):  [40 %-45 %] 40 % (11/20 0725) Weight:  [72.5 kg] 72.5 kg (11/20 0544) Weight change: -5.9 kg Last BM Date: 10/31/20  Intake/Output from previous day: 11/19 0701 - 11/20 0700 In: -  Out: 700 [Urine:700] No intake/output data recorded.   Physical Exam:  General- pt is unresponsive  Resp- No acute REsp distress, rhonchi present  CVS- S1S2 regular in rate and rhythm  GIT- BS+, soft, Non tender , Non distended  EXT- NO LE Edema, Cyanosis  Access-PD cath in situ   Lab Results:  CBC  Recent Labs    10/30/20 0708 10/31/20 0603  WBC 16.1* 26.5*  HGB 11.5* 10.4*  HCT 32.4* 29.7*  PLT 139* 121*    BMET  Recent Labs    10/31/20 0603 11/01/20 0454  NA 144 148*  K 4.2 3.9  CL 102 106  CO2 20* 18*  GLUCOSE 242* 243*  BUN 227* 241*  CREATININE 9.22* 9.61*  CALCIUM 7.1* 7.3*      Most recent Creatinine trend  Lab Results  Component Value Date   CREATININE 9.61 (H) 11/01/2020   CREATININE 9.22 (H) 10/31/2020   CREATININE 9.82 (H) 10/30/2020      MICRO   Recent Results (from the past 240 hour(s))  CULTURE, BLOOD (ROUTINE X 2) w Reflex to ID Panel     Status: None    Collection Time: 10/26/20  5:29 AM   Specimen: Left Antecubital; Blood  Result Value Ref Range Status   Specimen Description LEFT ANTECUBITAL  Final   Special Requests   Final    BOTTLES DRAWN AEROBIC AND ANAEROBIC Blood Culture adequate volume   Culture   Final    NO GROWTH 5 DAYS Performed at [facility], [address] [place], [place] [address]    Report Status 10/31/2020 FINAL  Final  CULTURE, BLOOD (ROUTINE X 2) w Reflex to ID Panel     Status: None   Collection Time: 10/26/20  7:19 AM   Specimen: BLOOD  Result Value Ref Range Status   Specimen Description BLOOD RAC  Final   Special Requests   Final    BOTTLES DRAWN AEROBIC AND ANAEROBIC Blood Culture adequate volume   Culture   Final    NO GROWTH 5 DAYS Performed at [facility], [address] [place], [place] [address]    Report Status 10/31/2020 FINAL  Final         Impression:    Willie Hahn a 64 y.o.malewith medical history significant forhypertension, hyperlipidemia, gout in remission, end-stage renal disease on peritoneal dialysis  presented to the emergency department for chief concerns ofshortness of breath x6 days worsening with minimal improvement with home inhaler for his COPD. patient was diagnosed with Covid and admitted for further care   1) ESRD Patient is on peritoneal dialysis Patient is currently being dialyzed. Patient is on CCPD   2)HTN  Blood pressure is not controlled   3)Anemia of chronic disease  CBC Latest Ref Rng & Units 10/31/2020 10/30/2020 10/29/2020  WBC 4.0 - 10.5 K/uL 26.5(H) 16.1(H) 15.9(H)  Hemoglobin 13.0 - 17.0 g/dL 10.4(L) 11.5(L) 11.4(L)  Hematocrit 39 - 52 % 29.7(L) 32.4(L) 31.6(L)  Platelets 150 - 400 K/uL 121(L) 139(L) 143(L)       HGb at goal (9--11)   4) Secondary hyperparathyroidism -CKD Mineral-Bone Disorder    Lab Results  Component Value Date   CALCIUM 7.3 (L) 11/01/2020   PHOS 11.9 (H) 11/01/2020    Secondary  Hyperparathyroidism present Phosphorus is not at goal.   5)Altered mental status Secondary to large left intracerebral hemorrhage   6) Electrolytes   BMP Latest Ref Rng & Units 11/01/2020 10/31/2020 10/30/2020  Glucose 70 - 99 mg/dL 243(H) 242(H) 244(H)  BUN 8 - 23 mg/dL 241(H) 227(H) 242(H)  Creatinine 0.61 - 1.24 mg/dL 9.61(H) 9.22(H) 9.82(H)  BUN/Creat Ratio 9 - 20 - - -  Sodium 135 - 145 mmol/L 148(H) 144 144  Potassium 3.5 - 5.1 mmol/L 3.9 4.2 4.4  Chloride 98 - 111 mmol/L 106 102 98  CO2 22 - 32 mmol/L 18(L) 20(L) 19(L)  Calcium 8.9 - 10.3 mg/dL 7.3(L) 7.1(L) 7.2(L)     Sodium Normonatremic   Potassium Normokalemic    7)Acid base  Non anion gap acidosis   Co2 is not at goal  8) social Patient is now comfort care    Plan:   Patient and the family has decided to not to pursue any aggressive measures of treatment Patient is now comfort care . We will sign off    Willie Hahn 11/01/2020, 10:15 AM

## 2020-11-01 NOTE — Progress Notes (Signed)
Per Dr. Posey Pronto, patient is comfort care/comfort measures only. Instructed to discontinue all unnecessary orders.

## 2020-11-01 NOTE — Plan of Care (Signed)
  Problem: Pain Managment: Goal: General experience of comfort will improve Outcome: Progressing   Problem: Safety: Goal: Ability to remain free from injury will improve Outcome: Progressing   

## 2020-11-01 NOTE — Progress Notes (Addendum)
PHARMACY CONSULT NOTE - FOLLOW UP  Pharmacy Consult for Electrolyte Monitoring and Replacement   Recent Labs: Potassium (mmol/L)  Date Value  11/01/2020 3.9   Magnesium (mg/dL)  Date Value  10/31/2020 2.7 (H)   Calcium (mg/dL)  Date Value  11/01/2020 7.3 (L)   Albumin (g/dL)  Date Value  11/01/2020 2.2 (L)  05/30/2015 3.8   Phosphorus (mg/dL)  Date Value  11/01/2020 11.9 (H)   Sodium (mmol/L)  Date Value  11/01/2020 148 (H)  05/30/2015 141   Corrected Ca: 8.14 mg/dL  Assessment: 64 y.o.malewith medical history significant forhypertension, hyperlipidemia, gout in remission, end-stage renal disease on peritoneal dialysis. He is beginning NG tube feedings and is at a high risk for refeeding   Goal of Therapy:  Electrolytes WNL  Plan:   No electrolyte replacement warranted today  Transitioning to comfort care. Pharmacy will sign off. Please re-consult if needed.   Pharmacy will replace electrolytes as needed  Willie Hahn ,PharmD Clinical Pharmacist 11/01/2020 8:26 AM

## 2020-11-02 DIAGNOSIS — J9601 Acute respiratory failure with hypoxia: Secondary | ICD-10-CM | POA: Diagnosis not present

## 2020-11-02 DIAGNOSIS — Z992 Dependence on renal dialysis: Secondary | ICD-10-CM

## 2020-11-02 DIAGNOSIS — R4189 Other symptoms and signs involving cognitive functions and awareness: Secondary | ICD-10-CM | POA: Diagnosis not present

## 2020-11-02 DIAGNOSIS — J449 Chronic obstructive pulmonary disease, unspecified: Secondary | ICD-10-CM | POA: Diagnosis present

## 2020-11-02 DIAGNOSIS — U071 COVID-19: Secondary | ICD-10-CM | POA: Diagnosis not present

## 2020-11-02 NOTE — Progress Notes (Signed)
Pt being transferred to 1C, room 114, Report called to receciving RN, Anda Kraft. Family made aware of transport.

## 2020-11-02 NOTE — Progress Notes (Signed)
Received report, pt settled into Room 114, comfortably resting on 35L o2 at 35%. Per report,  Family will be coming in tomorrow.

## 2020-11-02 NOTE — Progress Notes (Signed)
PROGRESS NOTE    Willie Hahn  BZJ:[id number] DOB: 1956-08-29 DOA: 10/28/2020 PCP: Birdie Sons, MD    Assessment & Plan:   Principal Problem:   Acute hypoxemic respiratory failure due to COVID-19 [facility]) Active Problems:   Coronary artery disease   Essential hypertension   Insomnia   Compulsive tobacco user syndrome   H/O placement of stent in anterior descending branch of left coronary artery   Mixed hyperlipidemia   CKD (chronic kidney disease) stage V requiring chronic dialysis (Willie Hahn)   Focal segmental glomerulosclerosis   COVID-19   Goals of care, [name], [name]    Willie Hahn a 64 y.o.malewith medical history significant forhypertension, hyperlipidemia, gout in remission, end-stage renal disease on peritoneal dialysis presented to the emergency department for chief concerns ofshortness of breath x6 days worsening with minimal improvement with home inhaler for his COPD.  Comfort care status --initiated on 10/31/20 --IV morphine PRN  --daughters to visit tomorrow (Monday) and will likely withdraw care afterwards (removing supplemental O2).  Acute metabolic encephalopathy secondary to severe intracranial hemorrhage with subarachnoid hemorrhage with history of hypertension and understanding of COVID infection  patient is under comfort care.   Acute hypoxic respiratory failure secondary to COVID-19 PNA, worsening Secondary bacterial PNA  CAD  COPD  Hypertension   ESRD on peritoneal dialysis -- dialysis has been discontinued  Secondary hyperparathyroidism  Hyperkalemia  Hyperlipidemia   Tobacco user  -failure to thrive/malnutrition/poor PO intake with change in mentation Nutrition Problem: Severe Malnutrition Etiology: chronic illness (COPD, ESRD on PD) Signs/Symptoms:  moderate fat depletion, severe fat depletion, moderate muscle depletion, severe muscle depletion   DVT prophylaxis: None:Comfort Care Code Status: DNR  Family Communication: brother updated on the phone today Status is: inpatient Dispo:   The patient is from: home Anticipated d/c is to: likely to pass in the hospital Anticipated d/c date is: 1-2 days Patient currently is not medically stable to d/c due to: currently comfort care, will withdraw supplemental oxygen tomorrow after final family visit.   Subjective and Interval History:  Pt was unresponsive, on heated hf.  Talked with brother, who said that pt's daughters will be visiting tomorrow, and will likely withdraw care after that.   Objective: Vitals:   11/01/20 1446 11/01/20 1951 11/01/20 2101 11/02/20 0818  BP:  (!) 164/81  (!) 155/80  Pulse:  87  94  Resp:  16  16  Temp:  98.4 F (36.9 C)  98.3 F (36.8 C)  TempSrc:  Axillary  Axillary  SpO2: 94% 92% 92% 90%  Weight:      Height:        Intake/Output Summary (Last 24 hours) at 11/02/2020 1808 Last data filed at 11/02/2020 0910 Gross per 24 hour  Intake --  Output 0 ml  Net 0 ml   Filed Weights   10/29/20 0336 10/31/20 0520 11/01/20 0544  Weight: 80.5 kg 78.4 kg 72.5 kg    Examination:   Constitutional: unresponsive CV: No cyanosis.   RESP: on heated hf Extremities: No effusions, edema in BLE.  Both hands in flexion. SKIN: warm, dry and intact   Data Reviewed: I have personally reviewed following labs and imaging studies  CBC: Recent Labs  Lab 10/27/20 0500 10/28/20 0450 10/29/20 0557 10/30/20 0708 10/31/20 0603  WBC 23.2* 11.3* 15.9* 16.1* 26.5*  HGB 12.6* 10.8*  11.4* 11.5* 10.4*  HCT 35.4* 31.1* 31.6* 32.4* 29.7*  MCV 89.2 90.4 88.5 91.0 90.8  PLT 167 127* 143* 139* 675*   Basic Metabolic Panel: Recent Labs  Lab 10/27/20 0500 10/27/20 0500 10/28/20 0450 10/29/20 0557 10/30/20 0708 10/31/20 0603 11/01/20 0454  NA 139   < > 136  141 144 144 148*  K 5.7*   < > 4.6 4.2 4.4 4.2 3.9  CL 96*   < > 97* 100 98 102 106  CO2 15*   < > 16* 18* 19* 20* 18*  GLUCOSE 210*   < > 265* 132* 244* 242* 243*  BUN 243*   < > 242* 229* 242* 227* 241*  CREATININE 11.09*   < > 10.46* 9.58* 9.82* 9.22* 9.61*  CALCIUM 6.9*   < > 6.5* 6.8* 7.2* 7.1* 7.3*  MG 2.2  --  2.3 2.3 2.8* 2.7*  --   PHOS  --   --   --   --   --   --  11.9*   < > = values in this interval not displayed.   GFR: Estimated Creatinine Clearance: 7.2 mL/min (A) (by C-G formula based on SCr of 9.61 mg/dL (H)). Liver Function Tests: Recent Labs  Lab 11/01/20 0454  ALBUMIN 2.2*   No results for input(s): LIPASE, AMYLASE in the last 168 hours. No results for input(s): AMMONIA in the last 168 hours. Coagulation Profile: No results for input(s): INR, PROTIME in the last 168 hours. Cardiac Enzymes: No results for input(s): CKTOTAL, CKMB, CKMBINDEX, TROPONINI in the last 168 hours. BNP (last 3 results) No results for input(s): PROBNP in the last 8760 hours. HbA1C: No results for input(s): HGBA1C in the last 72 hours. CBG: Recent Labs  Lab 10/30/20 0821 10/30/20 1148 10/30/20 1645 10/30/20 2159 10/31/20 1326  GLUCAP 227* 192* 160* 277* 221*   Lipid Profile: No results for input(s): CHOL, HDL, LDLCALC, TRIG, CHOLHDL, LDLDIRECT in the last 72 hours. Thyroid Function Tests: No results for input(s): TSH, T4TOTAL, FREET4, T3FREE, THYROIDAB in the last 72 hours. Anemia Panel: No results for input(s): VITAMINB12, FOLATE, FERRITIN, TIBC, IRON, RETICCTPCT in the last 72 hours. Sepsis Labs: Recent Labs  Lab 10/27/20 0500 10/28/20 0926  PROCALCITON 4.43 3.47    Recent Results (from the past 240 hour(s))  CULTURE, BLOOD (ROUTINE X 2) w Reflex to ID Panel     Status: None   Collection Time: 10/26/20  5:29 AM   Specimen: Left Antecubital; Blood  Result Value Ref Range Status   Specimen Description LEFT ANTECUBITAL  Final   Special Requests   Final    BOTTLES  DRAWN AEROBIC AND ANAEROBIC Blood Culture adequate volume   Culture   Final    NO GROWTH 5 DAYS Performed at [facility], [address] [place], [place] [address]    Report Status 10/31/2020 FINAL  Final  CULTURE, BLOOD (ROUTINE X 2) w Reflex to ID Panel     Status: None   Collection Time: 10/26/20  7:19 AM   Specimen: BLOOD  Result Value Ref Range Status   Specimen Description BLOOD RAC  Final   Special Requests   Final    BOTTLES DRAWN AEROBIC AND ANAEROBIC Blood Culture adequate volume   Culture   Final    NO GROWTH 5 DAYS Performed at [facility], [address] [place], [place] [address]    Report Status 10/31/2020 FINAL  Final      Radiology Studies: No results found.  Scheduled Meds:  gentamicin cream  1 application Topical Daily   mouth rinse  15 mL Mouth Rinse BID   Continuous Infusions:   LOS: 11 days     Enzo Bi, MD [facility] If 7PM-7AM, please contact night-coverage 11/02/2020, 6:08 PM

## 2020-11-12 NOTE — Progress Notes (Signed)
Patient expired at around Willie Hahn. Next of kin daughters Lyell Clugston and Beckem Tomberlin notified via telephone but no reply. Voicemail left for them to call the hospital. On call provider Sharion Settler, NP notified.

## 2020-11-12 NOTE — Discharge Summary (Signed)
Death Summary  Willie Hahn DOB: Sep 10, 1956 DOA: 11/17/20  PCP: Birdie Sons, MD  Admit date: 2020-11-17 Date of Death: 30-Nov-2020 Time of Death: 1:45 am   History of present illness:  Willie Wrinkle Smithwas a 64 y.o.malewith medical history significant forhypertension, hyperlipidemia, gout in remission, end-stage renal disease on peritoneal dialysis who presented to the emergency department for chief concerns ofshortness of breath x6 days worsening with minimal improvement with home inhaler for his COPD.  Comfort care status Initiated on 10/31/20.  # Acute metabolic encephalopathysecondary  # severe intracranial hemorrhage with subarachnoid hemorrhage  patient is under comfort care.  # Acute hypoxic respiratory failure secondary to COVID-19 PNA, worsening # Secondary bacterial PNA Was on heated hf.  Received steroid, Remdesivir and Combivent for COVID treatment.  Received ceftriaxone and azithromycin for CAP treatment.  CAD  COPD  Hypertension   ESRD on peritoneal dialysis dialysis discontinued after comfort care initiated.  Secondary hyperparathyroidism  Hyperkalemia  Hyperlipidemia   Tobacco user  failure to thrive/malnutrition/poor PO intake with change in mentation Nutrition Problem: Severe Malnutrition Etiology: chronic illness (COPD, ESRD on PD) Signs/Symptoms: moderate fat depletion, severe fat depletion, moderate muscle depletion, severe muscle depletion   The results of significant diagnostics from this hospitalization (including imaging, microbiology, ancillary and laboratory) are listed below for reference.    Significant Diagnostic Studies: DG Chest 1 View  Result Date: 10/26/2020 CLINICAL DATA:  Shortness of EXAM: CHEST  1 VIEW COMPARISON:  Breath November 17, 2020 FINDINGS: Worsening of bilateral mid lung opacities, likely indicating multifocal pneumonia. Normal pleural spaces and cardiomediastinal contours. IMPRESSION:  Slight worsening of multifocal pneumonia. Electronically Signed   By: Ulyses Jarred M.D.   On: 10/26/2020 00:10   DG Abd 1 View  Result Date: 10/31/2020 CLINICAL DATA:  Evaluate NG tube placement EXAM: ABDOMEN - 1 VIEW COMPARISON:  None. FINDINGS: The distal tip of the NG tube is above the GE junction. The side port is approximately 9.5 cm above the GE junction. IMPRESSION: The side port of the NG tube is 9.5 cm above the expected location of the GE junction. Recommend advancing at least 11 cm. These results will be called to the ordering clinician or representative by the Radiologist Assistant, and communication documented in the PACS or [employer]. Electronically Signed   By: Dorise Bullion III M.D   On: 10/31/2020 16:15   CT HEAD WO CONTRAST  Result Date: 10/31/2020 CLINICAL DATA:  64 year old male with a history of confusion EXAM: CT HEAD WITHOUT CONTRAST TECHNIQUE: Contiguous axial images were obtained from the base of the skull through the vertex without intravenous contrast. COMPARISON:  None. FINDINGS: Brain: Acute hemorrhage of the left parietooccipital lobe, with hemorrhage length estimated greatest 51 mm, estimated diameter 36 mm. Eight CT slice estimated with the 5 mm with. The volume is calculated approximately 36-37 cubic cm. Surrounding vasogenic edema extending to the cortex of the convexity and along the midline. Distortion and compression of the underlying left posterior horn of the ventricle. Smaller second focus of hemorrhage within the left subcortical frontal white matter, 3 mm-4 mm. Edema present within the subcortical white matter in the frontal lobe. Hyperdense focus within the sulci of the right frontal and parietal region without associated mass effect. No significant midline shift at the foramen of Monro. No evidence of hydrocephalus on the current CT. Vascular: Minimal intracranial atherosclerosis. Skull: No acute fracture. Sinuses/Orbits: Unremarkable paranasal sinuses  and the orbits. No fluid in the mastoid air cells. Other: None IMPRESSION:  Acute left parietal intracranial hemorrhage, with estimated volume approximately 36 cubic cm as above. Associated vasogenic edema with mass effect on the left posterior horn of the ventricle and no significant midline shift. These preliminary results were discussed by telephone at the time of interpretation on 10/31/2020 at 4:30 pm with Dr. Fritzi Mandes Additional small foci of right-sided subarachnoid hemorrhage in the frontal and parietal convexities as well as an additional focus of parenchymal hemorrhage in the left frontal subcortical white matter. Electronically Signed   By: Corrie Mckusick D.O.   On: 10/31/2020 16:36   DG Chest Portable 1 View  Result Date: 11/02/2020 CLINICAL DATA:  Shortness of breath, hypoxia, history COPD, evaluate infiltrate EXAM: PORTABLE CHEST 1 VIEW COMPARISON:  Portable exam 9811 hours compared to 03/01/2011 FINDINGS: Normal heart size, mediastinal contours, and pulmonary vascularity. Atherosclerotic calcification aorta. Bibasilar infiltrates consistent with multifocal pneumonia. No pleural effusion or pneumothorax. Osseous structures unremarkable. IMPRESSION: Bibasilar pulmonary infiltrates consistent with multifocal pneumonia. Electronically Signed   By: Lavonia Dana M.D.   On: 11/02/2020 13:10    Microbiology: Recent Results (from the past 240 hour(s))  CULTURE, BLOOD (ROUTINE X 2) w Reflex to ID Panel     Status: None   Collection Time: 10/26/20  5:29 AM   Specimen: Left Antecubital; Blood  Result Value Ref Range Status   Specimen Description LEFT ANTECUBITAL  Final   Special Requests   Final    BOTTLES DRAWN AEROBIC AND ANAEROBIC Blood Culture adequate volume   Culture   Final    NO GROWTH 5 DAYS Performed at [facility], [address] [place], [place] [address]    Report Status 10/31/2020 FINAL  Final  CULTURE, BLOOD (ROUTINE X 2) w Reflex to ID Panel     Status: None    Collection Time: 10/26/20  7:19 AM   Specimen: BLOOD  Result Value Ref Range Status   Specimen Description BLOOD RAC  Final   Special Requests   Final    BOTTLES DRAWN AEROBIC AND ANAEROBIC Blood Culture adequate volume   Culture   Final    NO GROWTH 5 DAYS Performed at [facility], [address] [place], [place] [address]    Report Status 10/31/2020 FINAL  Final     Labs: Basic Metabolic Panel: Recent Labs  Lab 10/28/20 0450 10/28/20 0450 10/29/20 0557 10/29/20 0557 10/30/20 0708 10/30/20 0708 10/31/20 0603 11/01/20 0454  NA 136  --  141  --  144  --  144 148*  K 4.6   < > 4.2   < > 4.4   < > 4.2 3.9  CL 97*  --  100  --  98  --  102 106  CO2 16*  --  18*  --  19*  --  20* 18*  GLUCOSE 265*  --  132*  --  244*  --  242* 243*  BUN 242*  --  229*  --  242*  --  227* 241*  CREATININE 10.46*  --  9.58*  --  9.82*  --  9.22* 9.61*  CALCIUM 6.5*  --  6.8*  --  7.2*  --  7.1* 7.3*  MG 2.3  --  2.3  --  2.8*  --  2.7*  --   PHOS  --   --   --   --   --   --   --  11.9*   < > = values in this interval not displayed.  Liver Function Tests: Recent Labs  Lab 11/01/20 0454  ALBUMIN 2.2*   No results for input(s): LIPASE, AMYLASE in the last 168 hours. No results for input(s): AMMONIA in the last 168 hours. CBC: Recent Labs  Lab 10/28/20 0450 10/29/20 0557 10/30/20 0708 10/31/20 0603  WBC 11.3* 15.9* 16.1* 26.5*  HGB 10.8* 11.4* 11.5* 10.4*  HCT 31.1* 31.6* 32.4* 29.7*  MCV 90.4 88.5 91.0 90.8  PLT 127* 143* 139* 121*   Cardiac Enzymes: No results for input(s): CKTOTAL, CKMB, CKMBINDEX, TROPONINI in the last 168 hours. D-Dimer No results for input(s): DDIMER in the last 72 hours. BNP: Invalid input(s): POCBNP CBG: Recent Labs  Lab 10/30/20 0821 10/30/20 1148 10/30/20 1645 10/30/20 2159 10/31/20 1326  GLUCAP 227* 192* 160* 277* 221*   Anemia work up No results for input(s): VITAMINB12, FOLATE, FERRITIN, TIBC, IRON, RETICCTPCT in the last 72  hours. Urinalysis    Component Value Date/Time   COLORURINE YELLOW (A) 01/24/2017 0901   APPEARANCEUR CLEAR (A) 01/24/2017 0901   LABSPEC 1.010 01/24/2017 0901   PHURINE 5.0 01/24/2017 0901   GLUCOSEU NEGATIVE 01/24/2017 0901   HGBUR NEGATIVE 01/24/2017 0901   BILIRUBINUR NEGATIVE 01/24/2017 0901   KETONESUR NEGATIVE 01/24/2017 0901   PROTEINUR >=300 (A) 01/24/2017 0901   NITRITE NEGATIVE 01/24/2017 0901   LEUKOCYTESUR NEGATIVE 01/24/2017 0901   Sepsis Labs Invalid input(s): PROCALCITONIN,  WBC,  LACTICIDVEN     SIGNED:  Enzo Bi, MD  [facility] Nov 27, 2020, 10:02 AM  No charge note.

## 2020-11-12 NOTE — Progress Notes (Signed)
[place] Willie Hahn was under comfort care measures due to his suffering and poor prognosis as he suffered from acute hypoxic respiratory failure secondary to COVID 19 pneumonia with secondary bacterial pneumonia and  underlying CAD and ESRD.  He passed away at 0145 am November 29, 2020.  Attempt to call daughter to inform per RN but unable to reach

## 2020-11-12 DEATH — deceased

## 2022-05-10 IMAGING — CT CT HEAD W/O CM
4 of 5 series · 15 of 30 positions shown, 17 images · non-contrast
Comparison: None.

CLINICAL DATA: 64-year-old male with a history of confusion

EXAM:
CT HEAD WITHOUT CONTRAST
TECHNIQUE: Contiguous axial images were obtained from the base of the skull
through the vertex without intravenous contrast.

[Series 2: head wo · axial · 0.39mm/px · z∈[+1133,+1233]mm · 5 of 32 slices shown, 7 images (1 of 2)]
[im 6/32  brain]
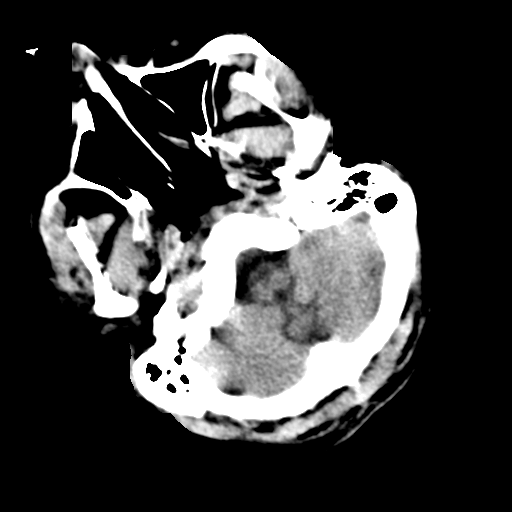
[im 6/32  bone]
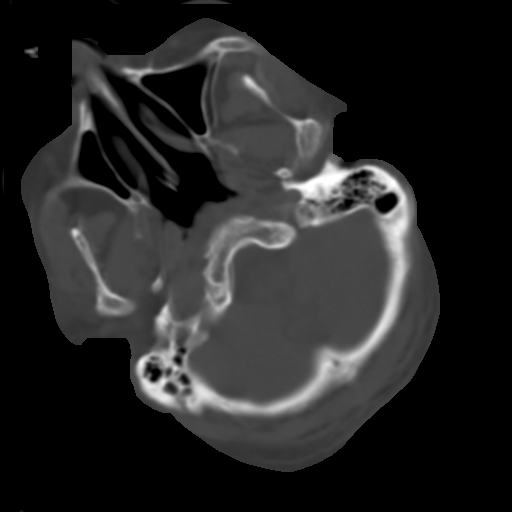
[im 11/32  brain]
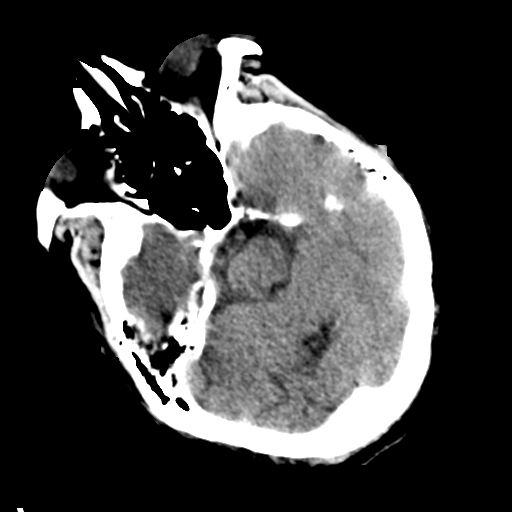
[im 16/32  brain]
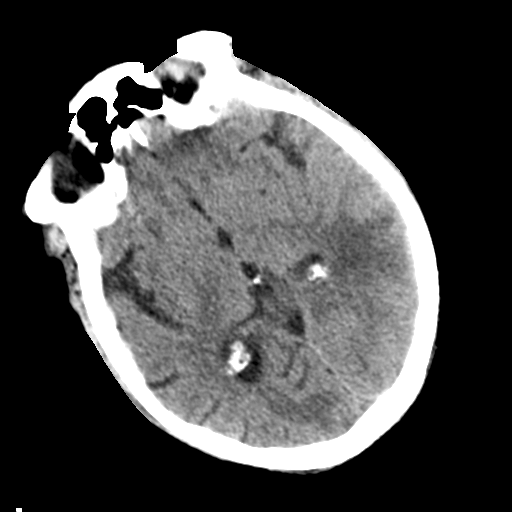
[im 21/32  brain]
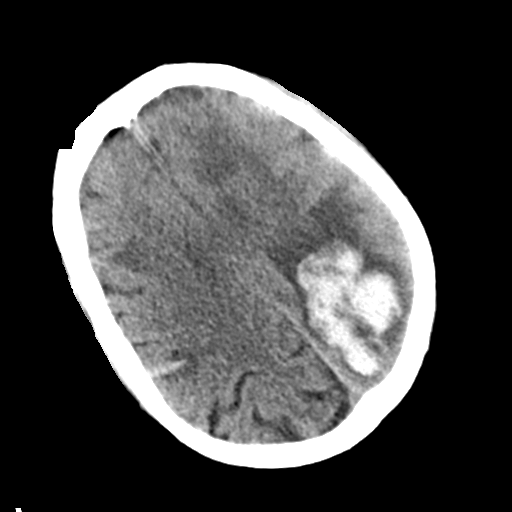
[im 26/32  brain]
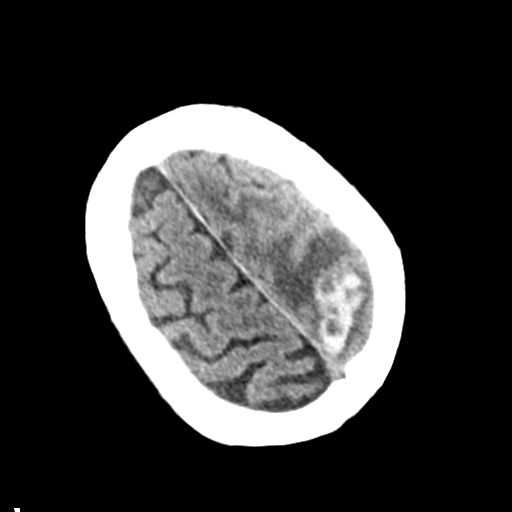
[im 26/32  bone]
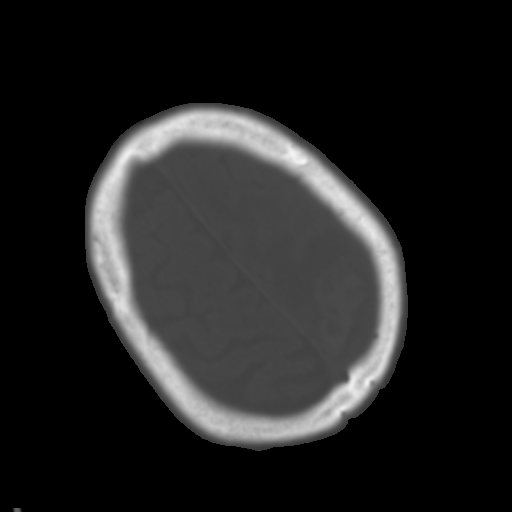

[Series 4: head wo recon · axial · 0.35mm/px · z∈[+1144,+1234]mm · 4 of 31 slices shown (1 of 2)]
[im 7/31  brain]
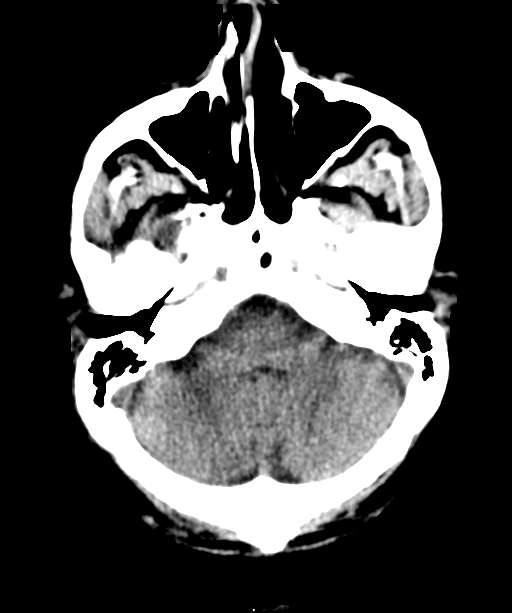
[im 13/31  brain]
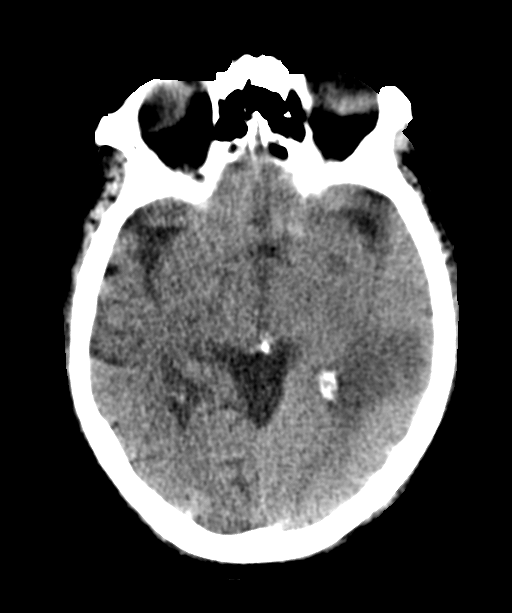
[im 19/31  brain]
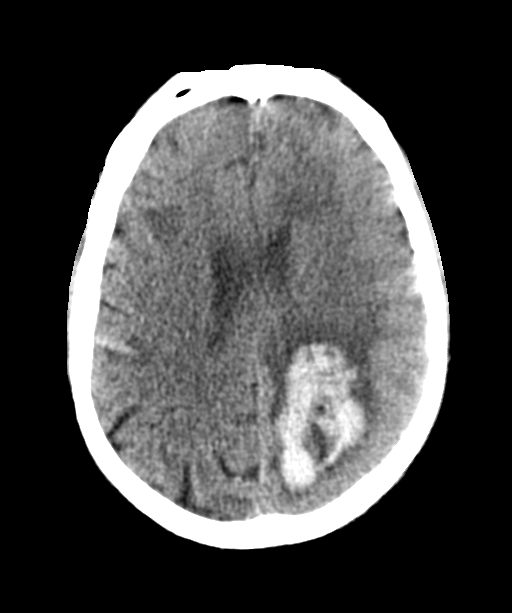
[im 25/31  brain]
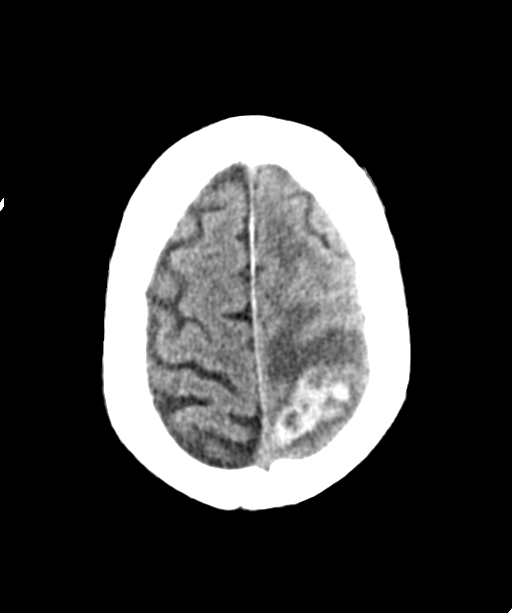

[Series 8: head wo · axial · 0.39mm/px · z∈[+1138,+1233]mm · 4 of 33 slices shown (2 of 2)]
[im 7/33  brain]
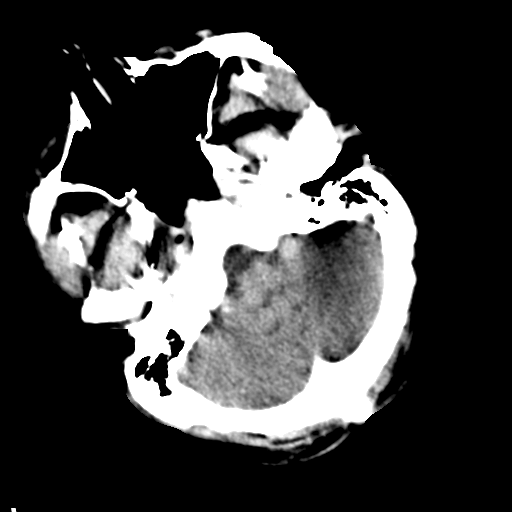
[im 13/33  brain]
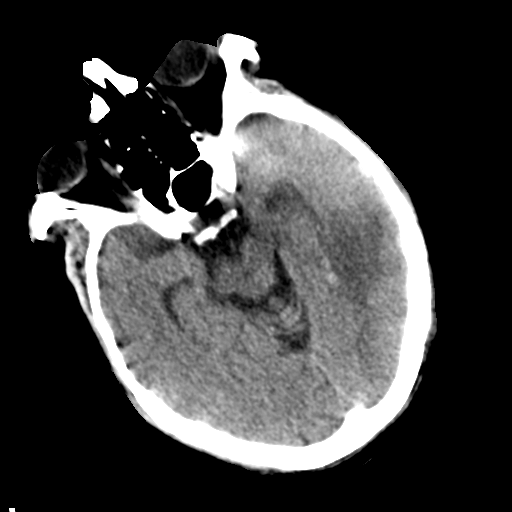
[im 20/33  brain]
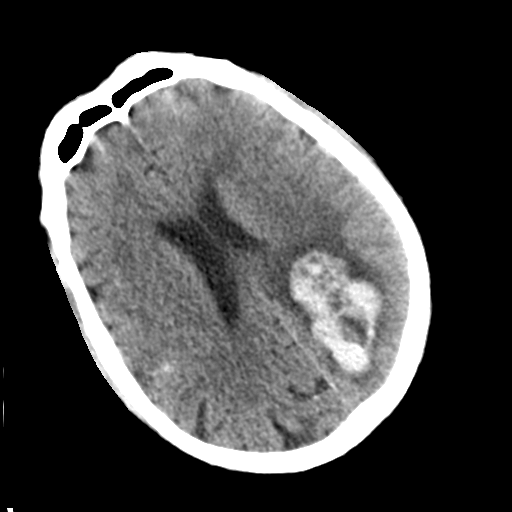
[im 26/33  brain]
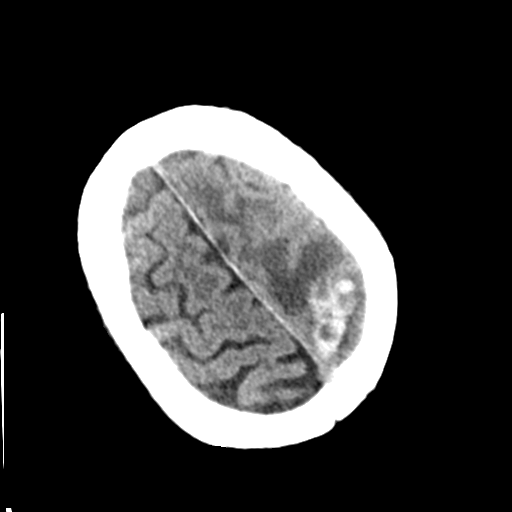

[Series 10: head wo recon · axial · 0.34mm/px · z∈[+1184,+1218]mm · 2 of 28 slices shown (2 of 2)]
[im 7/28  brain]
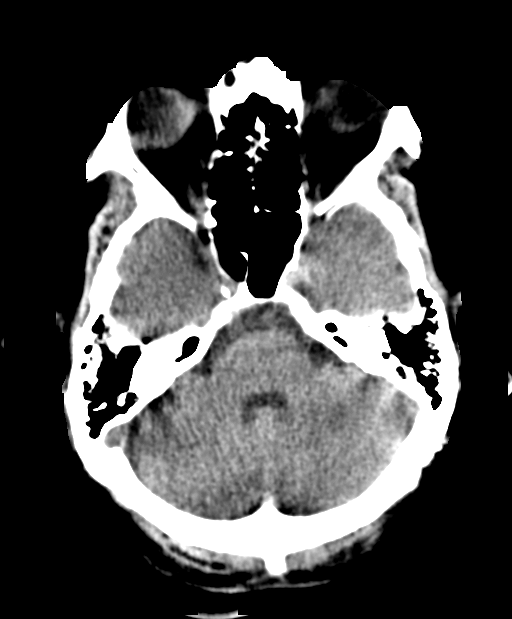
[im 14/28  brain]
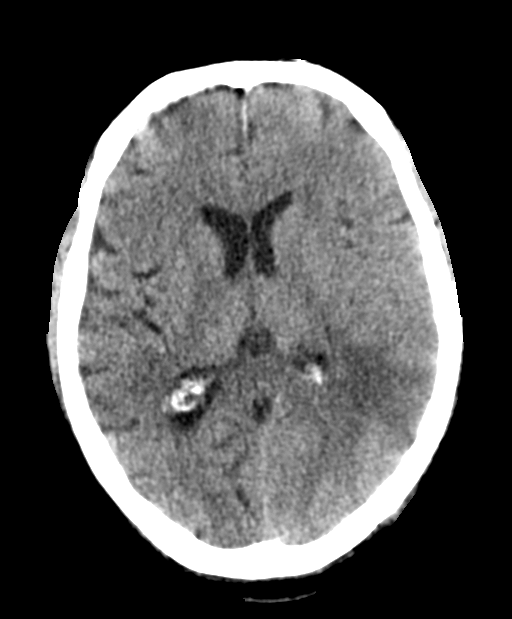

[15 of 30 positions shown; findings below may reference images not displayed]

FINDINGS: Brain: Acute hemorrhage of the left parietooccipital lobe, with
hemorrhage length estimated greatest 51 mm, estimated diameter 36
mm. Eight CT slice estimated with the 5 mm with. The volume is
calculated approximately 36-37 cubic cm. Surrounding vasogenic edema
extending to the cortex of the convexity and along the midline.
Distortion and compression of the underlying left posterior horn of
the ventricle.

Smaller second focus of hemorrhage within the left subcortical
frontal white matter, 3 mm-4 mm. Edema present within the
subcortical white matter in the frontal lobe.

Hyperdense focus within the sulci of the right frontal and parietal
region without associated mass effect.

No significant midline shift at the foramen of Vincent.

No evidence of hydrocephalus on the current CT.

Vascular: Minimal intracranial atherosclerosis.

Skull: No acute fracture.

Sinuses/Orbits: Unremarkable paranasal sinuses and the orbits. No
fluid in the mastoid air cells.

Other: None
IMPRESSION: Acute left parietal intracranial hemorrhage, with estimated volume
approximately 36 cubic cm as above. Associated vasogenic edema with
mass effect on the left posterior horn of the ventricle and no
significant midline shift.

These preliminary results were discussed by telephone at the time of
interpretation on 10/31/2020 at [DATE] with Dr. QOMANDAN TIGER

Additional small foci of right-sided subarachnoid hemorrhage in the
frontal and parietal convexities as well as an additional focus of
parenchymal hemorrhage in the left frontal subcortical white matter.
